# Patient Record
Sex: Male | Born: 1970 | Race: Black or African American | Hispanic: No | Marital: Married | Smoking: Never smoker
Health system: Southern US, Community
[De-identification: ages and names within clinical notes are randomized; demographics above are authoritative.]

## PROBLEM LIST (undated history)

## (undated) DIAGNOSIS — I10 Essential (primary) hypertension: Secondary | ICD-10-CM

## (undated) HISTORY — PX: NO PAST SURGERIES: SHX2092

---

## 2002-05-14 ENCOUNTER — Emergency Department (HOSPITAL_COMMUNITY): Admission: EM | Admit: 2002-05-14 | Discharge: 2002-05-14 | Payer: Self-pay | Admitting: Emergency Medicine

## 2002-06-20 ENCOUNTER — Emergency Department (HOSPITAL_COMMUNITY): Admission: EM | Admit: 2002-06-20 | Discharge: 2002-06-20 | Payer: Self-pay | Admitting: Emergency Medicine

## 2002-12-22 ENCOUNTER — Encounter: Admission: RE | Admit: 2002-12-22 | Discharge: 2002-12-22 | Payer: Self-pay | Admitting: Family Medicine

## 2003-01-05 ENCOUNTER — Encounter: Admission: RE | Admit: 2003-01-05 | Discharge: 2003-01-05 | Payer: Self-pay | Admitting: Sports Medicine

## 2003-03-20 ENCOUNTER — Encounter: Admission: RE | Admit: 2003-03-20 | Discharge: 2003-03-20 | Payer: Self-pay | Admitting: Family Medicine

## 2003-03-28 ENCOUNTER — Encounter: Admission: RE | Admit: 2003-03-28 | Discharge: 2003-03-28 | Payer: Self-pay | Admitting: Sports Medicine

## 2003-03-30 ENCOUNTER — Encounter: Admission: RE | Admit: 2003-03-30 | Discharge: 2003-03-30 | Payer: Self-pay | Admitting: Family Medicine

## 2003-10-01 ENCOUNTER — Emergency Department (HOSPITAL_COMMUNITY): Admission: EM | Admit: 2003-10-01 | Discharge: 2003-10-01 | Payer: Self-pay | Admitting: Emergency Medicine

## 2004-04-26 ENCOUNTER — Emergency Department (HOSPITAL_COMMUNITY): Admission: EM | Admit: 2004-04-26 | Discharge: 2004-04-26 | Payer: Self-pay | Admitting: Emergency Medicine

## 2006-01-13 ENCOUNTER — Emergency Department (HOSPITAL_COMMUNITY): Admission: EM | Admit: 2006-01-13 | Discharge: 2006-01-13 | Payer: Self-pay | Admitting: Emergency Medicine

## 2006-01-16 ENCOUNTER — Emergency Department (HOSPITAL_COMMUNITY): Admission: EM | Admit: 2006-01-16 | Discharge: 2006-01-16 | Payer: Self-pay | Admitting: Emergency Medicine

## 2006-10-19 ENCOUNTER — Emergency Department (HOSPITAL_COMMUNITY): Admission: EM | Admit: 2006-10-19 | Discharge: 2006-10-19 | Payer: Self-pay | Admitting: Emergency Medicine

## 2006-11-02 ENCOUNTER — Encounter: Admission: RE | Admit: 2006-11-02 | Discharge: 2006-11-02 | Payer: Self-pay | Admitting: Nephrology

## 2006-12-07 ENCOUNTER — Emergency Department (HOSPITAL_COMMUNITY): Admission: EM | Admit: 2006-12-07 | Discharge: 2006-12-07 | Payer: Self-pay | Admitting: Emergency Medicine

## 2007-09-02 ENCOUNTER — Emergency Department (HOSPITAL_COMMUNITY): Admission: EM | Admit: 2007-09-02 | Discharge: 2007-09-02 | Payer: Self-pay | Admitting: Emergency Medicine

## 2007-09-07 ENCOUNTER — Emergency Department (HOSPITAL_COMMUNITY): Admission: EM | Admit: 2007-09-07 | Discharge: 2007-09-08 | Payer: Self-pay | Admitting: Emergency Medicine

## 2008-02-02 ENCOUNTER — Emergency Department (HOSPITAL_COMMUNITY): Admission: EM | Admit: 2008-02-02 | Discharge: 2008-02-02 | Payer: Self-pay | Admitting: Emergency Medicine

## 2008-09-09 ENCOUNTER — Emergency Department (HOSPITAL_COMMUNITY): Admission: EM | Admit: 2008-09-09 | Discharge: 2008-09-09 | Payer: Self-pay | Admitting: Emergency Medicine

## 2009-02-04 ENCOUNTER — Emergency Department (HOSPITAL_COMMUNITY): Admission: EM | Admit: 2009-02-04 | Discharge: 2009-02-04 | Payer: Self-pay | Admitting: Emergency Medicine

## 2009-05-09 ENCOUNTER — Emergency Department (HOSPITAL_COMMUNITY): Admission: EM | Admit: 2009-05-09 | Discharge: 2009-05-09 | Payer: Self-pay | Admitting: Emergency Medicine

## 2009-06-28 ENCOUNTER — Emergency Department (HOSPITAL_COMMUNITY): Admission: EM | Admit: 2009-06-28 | Discharge: 2009-06-29 | Payer: Self-pay | Admitting: Emergency Medicine

## 2009-10-19 ENCOUNTER — Emergency Department (HOSPITAL_COMMUNITY): Admission: EM | Admit: 2009-10-19 | Discharge: 2009-10-19 | Payer: Self-pay | Admitting: Emergency Medicine

## 2009-11-25 ENCOUNTER — Emergency Department (HOSPITAL_COMMUNITY): Admission: EM | Admit: 2009-11-25 | Discharge: 2009-11-25 | Payer: Self-pay | Admitting: Emergency Medicine

## 2009-12-09 ENCOUNTER — Emergency Department (HOSPITAL_COMMUNITY): Admission: EM | Admit: 2009-12-09 | Discharge: 2009-12-09 | Payer: Self-pay | Admitting: Emergency Medicine

## 2010-05-13 ENCOUNTER — Inpatient Hospital Stay (INDEPENDENT_AMBULATORY_CARE_PROVIDER_SITE_OTHER)
Admission: RE | Admit: 2010-05-13 | Discharge: 2010-05-13 | Disposition: A | Discharge status: Home / Self Care | Payer: Self-pay | Source: Ambulatory Visit | Attending: Family Medicine | Admitting: Family Medicine

## 2010-05-13 DIAGNOSIS — N342 Other urethritis: Secondary | ICD-10-CM

## 2010-05-22 LAB — GC/CHLAMYDIA PROBE AMP, GENITAL
Chlamydia, DNA Probe: NEGATIVE
Chlamydia, DNA Probe: NEGATIVE
GC Probe Amp, Genital: NEGATIVE
GC Probe Amp, Genital: NEGATIVE

## 2010-05-23 LAB — RPR: RPR Ser Ql: NONREACTIVE

## 2010-05-23 LAB — GC/CHLAMYDIA PROBE AMP, GENITAL: Chlamydia, DNA Probe: NEGATIVE

## 2010-05-23 LAB — URINALYSIS, ROUTINE W REFLEX MICROSCOPIC
Bilirubin Urine: NEGATIVE
Hgb urine dipstick: NEGATIVE
Nitrite: NEGATIVE
Protein, ur: NEGATIVE mg/dL
Urobilinogen, UA: 1 mg/dL (ref 0.0–1.0)

## 2010-05-23 LAB — URINE CULTURE: Culture: NO GROWTH

## 2010-05-27 LAB — GC/CHLAMYDIA PROBE AMP, GENITAL
Chlamydia, DNA Probe: NEGATIVE
GC Probe Amp, Genital: NEGATIVE

## 2010-06-01 LAB — URINALYSIS, ROUTINE W REFLEX MICROSCOPIC
Bilirubin Urine: NEGATIVE
Hgb urine dipstick: NEGATIVE
Ketones, ur: NEGATIVE mg/dL
Nitrite: NEGATIVE
Protein, ur: 30 mg/dL — AB
Urobilinogen, UA: 1 mg/dL (ref 0.0–1.0)

## 2010-06-01 LAB — GC/CHLAMYDIA PROBE AMP, GENITAL
Chlamydia, DNA Probe: NEGATIVE
GC Probe Amp, Genital: NEGATIVE

## 2010-06-01 LAB — RAPID STREP SCREEN (MED CTR MEBANE ONLY): Streptococcus, Group A Screen (Direct): POSITIVE — AB

## 2010-06-11 LAB — GC/CHLAMYDIA PROBE AMP, GENITAL
Chlamydia, DNA Probe: NEGATIVE
GC Probe Amp, Genital: NEGATIVE

## 2010-12-09 LAB — URINALYSIS, ROUTINE W REFLEX MICROSCOPIC
Bilirubin Urine: NEGATIVE
Glucose, UA: NEGATIVE
Hgb urine dipstick: NEGATIVE
pH: 6

## 2011-09-28 ENCOUNTER — Emergency Department (INDEPENDENT_AMBULATORY_CARE_PROVIDER_SITE_OTHER)
Admission: EM | Admit: 2011-09-28 | Discharge: 2011-09-28 | Disposition: A | Discharge status: Home / Self Care | Payer: Self-pay | Source: Home / Self Care | Attending: Emergency Medicine | Admitting: Emergency Medicine

## 2011-09-28 ENCOUNTER — Encounter (HOSPITAL_COMMUNITY): Payer: Self-pay

## 2011-09-28 DIAGNOSIS — R358 Other polyuria: Secondary | ICD-10-CM

## 2011-09-28 DIAGNOSIS — R3589 Other polyuria: Secondary | ICD-10-CM

## 2011-09-28 LAB — GLUCOSE, CAPILLARY: Glucose-Capillary: 68 mg/dL — ABNORMAL LOW (ref 70–99)

## 2011-09-28 LAB — POCT URINALYSIS DIP (DEVICE)
Bilirubin Urine: NEGATIVE
Glucose, UA: NEGATIVE mg/dL
Ketones, ur: NEGATIVE mg/dL
pH: 7 (ref 5.0–8.0)

## 2011-09-28 NOTE — ED Provider Notes (Signed)
History     CSN: [id number]  Arrival date & time 09/28/11  1349   First MD Initiated Contact with Patient 09/28/11 1351      Chief Complaint  Patient presents with  . Urinary Frequency    (Consider location/radiation/quality/duration/timing/severity/associated sxs/prior treatment) HPI Comments: 41 year old male presents urgent care at complaining that during the day he has to urinate frequently this symptom seems to go come and go, he describes that he feels he needs to void freely during the day more than so at night. Denies any pain, pressure or burning with urination. Denies any penile discharge, no testicular pain. Patient denies any constitutional symptoms such as fevers, bodyaches, unintentional weight loss.  Patient is a 41 y.o. male presenting with frequency. The history is provided by the patient.  Urinary Frequency This is a new problem. The current episode started more than 1 week ago. The problem has not changed since onset.Pertinent negatives include no abdominal pain and no shortness of breath. He has tried nothing for the symptoms.    History reviewed. No pertinent past medical history.  History reviewed. No pertinent past surgical history.  History reviewed. No pertinent family history.  History  Substance Use Topics  . Smoking status: Not on file  . Smokeless tobacco: Not on file  . Alcohol Use: Not on file      Review of Systems  Constitutional: Negative for fever, chills, activity change, appetite change and unexpected weight change.  Respiratory: Negative for shortness of breath.   Gastrointestinal: Negative for abdominal pain.  Genitourinary: Positive for frequency. Negative for dysuria, urgency, decreased urine volume, discharge, penile swelling, scrotal swelling, enuresis, difficulty urinating, penile pain and testicular pain.  Musculoskeletal: Negative for back pain and arthralgias.    Allergies  Review of patient's allergies indicates not on  file.  Home Medications  No current outpatient prescriptions on file.  BP 169/100  Pulse 76  Temp 98.5 F (36.9 C) (Oral)  Resp 16  SpO2 98%  Physical Exam  Vitals reviewed. Constitutional: He appears well-developed and well-nourished. No distress.  Neurological: He is alert.  Psychiatric: He has a normal mood and affect.    ED Course  Procedures (including critical care time)  Labs Reviewed  GLUCOSE, CAPILLARY - Abnormal; Notable for the following:    Glucose-Capillary 68 (*)     All other components within normal limits  POCT URINALYSIS DIP (DEVICE) - Abnormal; Notable for the following:    Hgb urine dipstick TRACE (*)     All other components within normal limits   No results found.   1. Polyuria       MDM  On further history patient admits of drinking 3-4 glasses of soda swollen he's eating anything and at times also drinks a lot of juice during the day. Patient has been seen for similar symptoms on 3-4 different occasions which urine yield normal results. In multiple Chlamydia and gonorrhea DNA probes have been done with negative results. Today patient refuses to be tested for screened for STDs as he has no concerns and denies any penile discharge. He provided a urine sample today at urgent care which he describes he has no problems urinating or any type of discomfort. Patient was advised to do 2 things #1, decrease the amount of oral fluids especially sodas and monitor his frequent urination. #2 was advised to followup with the urologist to be screened for his prostate. He understood and agrees with both treatment, monitoring in reducing sodas and take and  followup with urology. Patient was also advised to followup with primary care Dr. for blood pressure repeat as his blood pressures were elevated today he agreed.        Jimmie Molly, MD 09/28/11 [phone]

## 2011-09-28 NOTE — ED Notes (Signed)
States he has been having to get up at night to urinate and has to void frequently during the day; denies pani w urination, but feels as if he is going more frequently and passing more UA than usual

## 2013-09-25 ENCOUNTER — Emergency Department (HOSPITAL_COMMUNITY): Payer: No Typology Code available for payment source

## 2013-09-25 ENCOUNTER — Encounter (HOSPITAL_COMMUNITY): Payer: Self-pay | Admitting: Emergency Medicine

## 2013-09-25 ENCOUNTER — Observation Stay (HOSPITAL_COMMUNITY)
Admission: EM | Admit: 2013-09-25 | Discharge: 2013-09-27 | Disposition: A | Discharge status: Home / Self Care | Payer: No Typology Code available for payment source | Attending: Internal Medicine | Admitting: Internal Medicine

## 2013-09-25 DIAGNOSIS — E781 Pure hyperglyceridemia: Secondary | ICD-10-CM | POA: Insufficient documentation

## 2013-09-25 DIAGNOSIS — R0602 Shortness of breath: Secondary | ICD-10-CM | POA: Insufficient documentation

## 2013-09-25 DIAGNOSIS — I10 Essential (primary) hypertension: Secondary | ICD-10-CM

## 2013-09-25 DIAGNOSIS — N289 Disorder of kidney and ureter, unspecified: Secondary | ICD-10-CM | POA: Diagnosis not present

## 2013-09-25 DIAGNOSIS — R0989 Other specified symptoms and signs involving the circulatory and respiratory systems: Secondary | ICD-10-CM

## 2013-09-25 DIAGNOSIS — Z9981 Dependence on supplemental oxygen: Secondary | ICD-10-CM

## 2013-09-25 DIAGNOSIS — J189 Pneumonia, unspecified organism: Secondary | ICD-10-CM

## 2013-09-25 DIAGNOSIS — J45909 Unspecified asthma, uncomplicated: Secondary | ICD-10-CM | POA: Insufficient documentation

## 2013-09-25 DIAGNOSIS — R0902 Hypoxemia: Secondary | ICD-10-CM | POA: Diagnosis present

## 2013-09-25 DIAGNOSIS — J441 Chronic obstructive pulmonary disease with (acute) exacerbation: Secondary | ICD-10-CM

## 2013-09-25 DIAGNOSIS — R55 Syncope and collapse: Secondary | ICD-10-CM | POA: Insufficient documentation

## 2013-09-25 DIAGNOSIS — R0600 Dyspnea, unspecified: Secondary | ICD-10-CM

## 2013-09-25 DIAGNOSIS — R0789 Other chest pain: Secondary | ICD-10-CM | POA: Diagnosis not present

## 2013-09-25 DIAGNOSIS — R030 Elevated blood-pressure reading, without diagnosis of hypertension: Secondary | ICD-10-CM | POA: Diagnosis present

## 2013-09-25 DIAGNOSIS — R0609 Other forms of dyspnea: Secondary | ICD-10-CM

## 2013-09-25 HISTORY — DX: Essential (primary) hypertension: I10

## 2013-09-25 LAB — COMPREHENSIVE METABOLIC PANEL
ALK PHOS: 81 U/L (ref 39–117)
ALT: 38 U/L (ref 0–53)
AST: 38 U/L — ABNORMAL HIGH (ref 0–37)
Albumin: 4.1 g/dL (ref 3.5–5.2)
Anion gap: 18 — ABNORMAL HIGH (ref 5–15)
BUN: 11 mg/dL (ref 6–23)
CO2: 22 mEq/L (ref 19–32)
Calcium: 9.1 mg/dL (ref 8.4–10.5)
Chloride: 100 mEq/L (ref 96–112)
Creatinine, Ser: 1.01 mg/dL (ref 0.50–1.35)
GFR calc non Af Amer: >90 mL/min (ref >90)
Glucose, Bld: 96 mg/dL (ref 70–99)
POTASSIUM: 4.8 meq/L (ref 3.7–5.3)
SODIUM: 140 meq/L (ref 137–147)
Total Bilirubin: 0.5 mg/dL (ref 0.3–1.2)
Total Protein: 8.5 g/dL — ABNORMAL HIGH (ref 6.0–8.3)

## 2013-09-25 LAB — I-STAT CHEM 8, ED
BUN: 11 mg/dL (ref 6–23)
CALCIUM ION: 1.15 mmol/L (ref 1.12–1.23)
CHLORIDE: 103 meq/L (ref 96–112)
Creatinine, Ser: 1.3 mg/dL (ref 0.50–1.35)
GLUCOSE: 107 mg/dL — AB (ref 70–99)
HEMATOCRIT: 49 % (ref 39.0–52.0)
Hemoglobin: 16.7 g/dL (ref 13.0–17.0)
Potassium: 4 mEq/L (ref 3.7–5.3)
Sodium: 141 mEq/L (ref 137–147)
TCO2: 25 mmol/L (ref 0–100)

## 2013-09-25 LAB — LIPID PANEL
CHOL/HDL RATIO: 4.4 ratio
CHOLESTEROL: 150 mg/dL (ref 0–200)
HDL: 34 mg/dL — ABNORMAL LOW (ref >39)
LDL Cholesterol: 59 mg/dL (ref 0–99)
Triglycerides: 284 mg/dL — ABNORMAL HIGH (ref <150)
VLDL: 57 mg/dL — ABNORMAL HIGH (ref 0–40)

## 2013-09-25 LAB — TROPONIN I: Troponin I: <0.3 ng/mL (ref <0.30)

## 2013-09-25 LAB — CBC
HEMATOCRIT: 45.3 % (ref 39.0–52.0)
Hemoglobin: 15.8 g/dL (ref 13.0–17.0)
MCH: 30.5 pg (ref 26.0–34.0)
MCHC: 34.9 g/dL (ref 30.0–36.0)
MCV: 87.5 fL (ref 78.0–100.0)
Platelets: 160 10*3/uL (ref 150–400)
RBC: 5.18 MIL/uL (ref 4.22–5.81)
RDW: 11.8 % (ref 11.5–15.5)
WBC: 3.6 10*3/uL — ABNORMAL LOW (ref 4.0–10.5)

## 2013-09-25 LAB — D-DIMER, QUANTITATIVE (NOT AT ARMC)

## 2013-09-25 LAB — RAPID URINE DRUG SCREEN, HOSP PERFORMED
AMPHETAMINES: NOT DETECTED
BARBITURATES: NOT DETECTED
BENZODIAZEPINES: NOT DETECTED
Cocaine: NOT DETECTED
Opiates: NOT DETECTED
TETRAHYDROCANNABINOL: NOT DETECTED

## 2013-09-25 LAB — PRO B NATRIURETIC PEPTIDE: Pro B Natriuretic peptide (BNP): 7.7 pg/mL (ref 0–125)

## 2013-09-25 LAB — HEMOGLOBIN A1C
Hgb A1c MFr Bld: 4.9 % (ref <5.7)
MEAN PLASMA GLUCOSE: 94 mg/dL (ref <117)

## 2013-09-25 LAB — STREP PNEUMONIAE URINARY ANTIGEN: Strep Pneumo Urinary Antigen: NEGATIVE

## 2013-09-25 LAB — I-STAT TROPONIN, ED: TROPONIN I, POC: 0.01 ng/mL (ref 0.00–0.08)

## 2013-09-25 LAB — HIV ANTIBODY (ROUTINE TESTING W REFLEX): HIV 1&2 Ab, 4th Generation: NONREACTIVE

## 2013-09-25 MED ORDER — SODIUM CHLORIDE 0.9 % IV SOLN
INTRAVENOUS | Status: AC
Start: 2013-09-25 — End: 2013-09-25
  Administered 2013-09-25 (×2): via INTRAVENOUS

## 2013-09-25 MED ORDER — ALBUTEROL SULFATE HFA 108 (90 BASE) MCG/ACT IN AERS
2.0000 | INHALATION_SPRAY | Freq: Once | RESPIRATORY_TRACT | Status: AC
  Administered 2013-09-25: 2 via RESPIRATORY_TRACT
  Filled 2013-09-25: qty 6.7

## 2013-09-25 MED ORDER — ONDANSETRON HCL 4 MG/2ML IJ SOLN: 4.0000 mg | Freq: Four times a day (QID) | INTRAMUSCULAR | Status: DC | PRN

## 2013-09-25 MED ORDER — ENOXAPARIN SODIUM 40 MG/0.4ML ~~LOC~~ SOLN
40.0000 mg | SUBCUTANEOUS | Status: DC
  Administered 2013-09-25 – 2013-09-26 (×2): 40 mg via SUBCUTANEOUS
  Filled 2013-09-25 (×3): qty 0.4

## 2013-09-25 MED ORDER — AZITHROMYCIN 500 MG PO TABS
500.0000 mg | ORAL_TABLET | ORAL | Status: DC
  Administered 2013-09-26 – 2013-09-27 (×2): 500 mg via ORAL
  Filled 2013-09-25 (×3): qty 1

## 2013-09-25 MED ORDER — ONDANSETRON HCL 4 MG PO TABS: 4.0000 mg | ORAL_TABLET | Freq: Four times a day (QID) | ORAL | Status: DC | PRN

## 2013-09-25 MED ORDER — AZITHROMYCIN 250 MG PO TABS
500.0000 mg | ORAL_TABLET | Freq: Once | ORAL | Status: AC
  Administered 2013-09-25: 500 mg via ORAL
  Filled 2013-09-25: qty 2

## 2013-09-25 MED ORDER — CEFTRIAXONE SODIUM 1 G IJ SOLR
1.0000 g | INTRAMUSCULAR | Status: DC
  Administered 2013-09-25 – 2013-09-26 (×2): 1 g via INTRAVENOUS
  Filled 2013-09-25 (×2): qty 10

## 2013-09-25 MED ORDER — SODIUM CHLORIDE 0.9 % IJ SOLN
3.0000 mL | Freq: Two times a day (BID) | INTRAMUSCULAR | Status: DC
  Administered 2013-09-25 – 2013-09-26 (×3): 3 mL via INTRAVENOUS

## 2013-09-25 MED ORDER — IPRATROPIUM-ALBUTEROL 0.5-2.5 (3) MG/3ML IN SOLN
3.0000 mL | RESPIRATORY_TRACT | Status: DC
  Administered 2013-09-25 (×2): 3 mL via RESPIRATORY_TRACT
  Filled 2013-09-25 (×4): qty 3

## 2013-09-25 NOTE — ED Notes (Signed)
Phlebotomy at the bedside  

## 2013-09-25 NOTE — ED Notes (Signed)
Oxygen at 2l via  due to sats less than 90

## 2013-09-25 NOTE — Progress Notes (Signed)
ABG attempt X 1. Pt moved and said he didn't want the abg to be done. Pt refuses abg at this time. Pt is on RA sats 95%. Pt in no distress a this time.

## 2013-09-25 NOTE — Consult Note (Signed)
CARDIOLOGY CONSULT NOTE     Patient ID: Dustin Luna MRN: [id number] DOB/AGE: Feb 12, 1971 43 y.o.  Admit date: 09/25/2013 Referring Physician Charlsie Merles MD Primary Physician No PCP Per Patient Primary Cardiologist N/A Reason for Consultation chest pain  HPI: Mr. Dustin Luna is a 43 yo BM seen at the request of the internal medicine teaching service for evaluation of chest pain. He reports a head and chest cold a couple of weeks ago. Since then he has experienced symptoms of SOB at rest and with exertion. Minimal cough. No fever. He has some intermittent chest pain described as a pulled muscle in the left pectoral region. This is not related to exertion. He denies wheezes. No prior cardiac history. He has some intermittent elevation of BP but has never required antihypertensive therapy. No history of tobacco use. Denies DM. Doesn't know his cholesterol. He states he doesn't get any exercise and is currently unemployed.  Past Medical History  Diagnosis Date  . Hypertension     No family history on file.  History   Social History  . Marital Status: Single    Spouse Name: N/A    Number of Children: N/A  . Years of Education: N/A   Occupational History  . Not on file.   Social History Main Topics  . Smoking status: Never Smoker   . Smokeless tobacco: Never Used  . Alcohol Use: Yes  . Drug Use: No  . Sexual Activity: Yes   Other Topics Concern  . Not on file   Social History Narrative  . No narrative on file    History reviewed. No pertinent past surgical history.   No prescriptions prior to admission     ROS: As noted in HPI. Some runny nose. No edema. All other systems are reviewed and are negative unless otherwise mentioned.   Physical Exam: Blood pressure 109/73, pulse 81, temperature 98.1 F (36.7 C), temperature source Oral, resp. rate 20, height 5\' 10"  (1.778 m), weight 227 lb (102.967 kg), SpO2 96.00%. Current Weight  09/25/13 227 lb (102.967 kg)    GENERAL:   Well appearing BM in NAD HEENT:  PERRL, EOMI, sclera are clear. Oropharynx is clear. NECK:  No jugular venous distention, carotid upstroke brisk and symmetric, no bruits, no thyromegaly or adenopathy LUNGS:  Clear to auscultation bilaterally CHEST:  Unremarkable HEART:  RRR,  PMI not displaced or sustained,S1 and S2 within normal limits, no S3, no S4: no clicks, no rubs, no murmurs ABD:  Soft, nontender. BS +, no masses or bruits. No hepatomegaly, no splenomegaly EXT:  2 + pulses throughout, no edema, no cyanosis no clubbing SKIN:  Warm and dry.  No rashes NEURO:  Alert and oriented x 3. Cranial nerves II through XII intact. PSYCH:  Cognitively intact    Labs:   Lab Results  Component Value Date   WBC 3.6* 09/25/2013   HGB 16.7 09/25/2013   HCT 49.0 09/25/2013   MCV 87.5 09/25/2013   PLT 160 09/25/2013     Recent Labs Lab 09/25/13 0935  NA 140  K 4.8  CL 100  CO2 22  BUN 11  CREATININE 1.01  CALCIUM 9.1  PROT 8.5*  BILITOT 0.5  ALKPHOS 81  ALT 38  AST 38*  GLUCOSE 96   Lab Results  Component Value Date   TROPONINI <0.30 09/25/2013   TROPONINI <0.30 09/25/2013    Lab Results  Component Value Date   CHOL 150 09/25/2013   Lab Results  Component Value Date  HDL 34* 09/25/2013   Lab Results  Component Value Date   LDLCALC 59 09/25/2013   Lab Results  Component Value Date   TRIG 284* 09/25/2013   Lab Results  Component Value Date   CHOLHDL 4.4 09/25/2013   No results found for this basename: LDLDIRECT    Lab Results  Component Value Date   PROBNP 7.7 09/25/2013   No results found for this basename: TSH   Lab Results  Component Value Date   HGBA1C 4.9 09/25/2013   D-dimer < .27  Radiology: Dg Chest 2 View  09/25/2013   CLINICAL DATA:  Shortness of breath, cough.  EXAM: CHEST  2 VIEW  COMPARISON:  None available  FINDINGS: There is mild cardiomegaly. Mediastinal silhouette within normal limits.  The lungs are hypoinflated. Patchy bibasilar opacities are  present, favored to reflect atelectasis related to shallow lung inflation, although possible infiltrates are not entirely excluded, particularly at the medial right lung base. No pulmonary edema or pleural effusion. No pneumothorax.  No acute osseous abnormality identified.  IMPRESSION: 1. Shallow lung inflation with patchy bibasilar opacities. While atelectasis is favored, possible superimposed infiltrates are not entirely excluded, particularly at the right lung base. 2. Cardiomegaly without pulmonary edema or pleural effusion.   Electronically Signed   By: Rise MuBenjamin  McClintock M.D.   On: 09/25/2013 06:46    EKG: NSR, borderling early repolarization.  ASSESSMENT AND PLAN:  1. Atypical chest pain. Doubt cardiac. History more suggestive of musculoskeletal pain. Low cardiac risk with negative Ecg and cardiac enzymes.   2. Dyspnea with hypoxia. History suggestive of inflammatory process. No evidence of CHF. With normal D-dimer PE is unlikely. Given mild cardiomegaly on CXR will check Echo but I think current workup outlined by medical service is appropriate. If Echo is normal I don't think any other cardiac workup is indicated.   Signed: Camaya Gannett [place], MDFACC  09/25/2013, 4:19 PM

## 2013-09-25 NOTE — ED Notes (Signed)
Ambulated pt with portable pulse ox. Pt sats dropped to 88-89% but came back up to 91-92 % while he was walking. Upon getting back to the room, his saturation went up to 94 %.

## 2013-09-25 NOTE — ED Provider Notes (Signed)
CSN: [id number]     Arrival date & time 09/25/13  0527 History   First MD Initiated Contact with Patient 09/25/13 0559     Chief Complaint  Patient presents with  . Shortness of Breath     (Consider location/radiation/quality/duration/timing/severity/associated sxs/prior Treatment) HPI Comments: Patient is a 43 yo M PMHx significant for HTN presenting to the ED for one month of intermittent episodes of shortness of breath, last episode was this morning. The patient's wife states that he was walking around very short of breath, appeared uncomfortable and to be sweating. Patient states his breathing has improved since being in the ED, but has some lingering chest tightness. Alleviating factors: none. Aggravating factors: deep inspiration. Medications tried prior to arrival: none. Patient is just getting over a cold. Denies any history of SOB similar in past. PERC negative.     Patient is a 43 y.o. male presenting with shortness of breath.  Shortness of Breath   Past Medical History  Diagnosis Date  . Hypertension    History reviewed. No pertinent past surgical history. No family history on file. History  Substance Use Topics  . Smoking status: Never Smoker   . Smokeless tobacco: Never Used  . Alcohol Use: Yes    Review of Systems  Respiratory: Positive for chest tightness and shortness of breath.   All other systems reviewed and are negative.     Allergies  Review of patient's allergies indicates no known allergies.  Home Medications   Prior to Admission medications   Not on File   BP 130/89  Pulse 82  Temp(Src) 98.1 F (36.7 C) (Oral)  Resp 10  Ht 5\' 10"  (1.778 m)  Wt 230 lb (104.327 kg)  BMI 33.00 kg/m2  SpO2 92% Physical Exam  Nursing note and vitals reviewed. Constitutional: He is oriented to person, place, and time. He appears well-developed and well-nourished. No distress.  HENT:  Head: Normocephalic and atraumatic.  Right Ear: External ear normal.   Left Ear: External ear normal.  Nose: Nose normal.  Mouth/Throat: Oropharynx is clear and moist. No oropharyngeal exudate.  Eyes: Conjunctivae are normal.  Neck: Normal range of motion. Neck supple.  Cardiovascular: Normal rate, regular rhythm and normal heart sounds.   Pulmonary/Chest: Effort normal. No respiratory distress. He has decreased breath sounds (mildly diminished in lower lung fields). He exhibits tenderness.  Abdominal: Soft. Bowel sounds are normal. There is no tenderness.  Musculoskeletal: Normal range of motion. He exhibits no edema.  Lymphadenopathy:    He has cervical adenopathy.  Neurological: He is alert and oriented to person, place, and time.  Skin: Skin is warm and dry. He is not diaphoretic.  Psychiatric: He has a normal mood and affect.    ED Course  Procedures (including critical care time) Medications  albuterol (PROVENTIL HFA;VENTOLIN HFA) 108 (90 BASE) MCG/ACT inhaler 2 puff (2 puffs Inhalation Given 09/25/13 0721)  azithromycin (ZITHROMAX) tablet 500 mg (500 mg Oral Given 09/25/13 0720)    Labs Review Labs Reviewed  CBC - Abnormal; Notable for the following:    WBC 3.6 (*)    All other components within normal limits  I-STAT CHEM 8, ED - Abnormal; Notable for the following:    Glucose, Bld 107 (*)    All other components within normal limits  PRO B NATRIURETIC PEPTIDE  I-STAT TROPOININ, ED    Imaging Review Dg Chest 2 View  09/25/2013   CLINICAL DATA:  Shortness of breath, cough.  EXAM: CHEST  2 VIEW  COMPARISON:  None available  FINDINGS: There is mild cardiomegaly. Mediastinal silhouette within normal limits.  The lungs are hypoinflated. Patchy bibasilar opacities are present, favored to reflect atelectasis related to shallow lung inflation, although possible infiltrates are not entirely excluded, particularly at the medial right lung base. No pulmonary edema or pleural effusion. No pneumothorax.  No acute osseous abnormality identified.   IMPRESSION: 1. Shallow lung inflation with patchy bibasilar opacities. While atelectasis is favored, possible superimposed infiltrates are not entirely excluded, particularly at the right lung base. 2. Cardiomegaly without pulmonary edema or pleural effusion.   Electronically Signed   By: Rise MuBenjamin  McClintock M.D.   On: 09/25/2013 06:46     EKG Interpretation   Date/Time:  Monday September 25 2013 05:31:56 EDT Ventricular Rate:  80 PR Interval:  161 QRS Duration: 81 QT Interval:  374 QTC Calculation: 431 R Axis:   58 Text Interpretation:  Sinus rhythm ST elev, probable normal early repol  pattern No old tracing to compare Confirmed by OTTER  MD, OLGA ([id number]) on  09/25/2013 5:41:42 AM      MDM   Final diagnoses:  CAP (community acquired pneumonia)  Hypoxia    Filed Vitals:   09/25/13 0715  BP: 130/89  Pulse: 82  Temp:   Resp:    Afebrile, NAD, non-toxic appearing, AAOx4.  Patient presenting with one month of intermittent shortness of breath and chest tightness. Per EMS patient with low oxygen saturations upon arrival placed on 2 L of nasal cannula. No signs of respiratory distress. No accessory muscle use. No tachypnea or tachycardia. Mildly diminished breath sounds in lower lung fields. Patient taken off of oxygen, O2 saturations dropped down to 89%. Chest x-ray concerning for possible community-acquired pneumonia with infiltrate. I have reviewed nursing notes, vital signs, and all appropriate lab and imaging results for this patient. Oxygen saturations waxed and waned off oxygen. Attempted ambulate patient was unable to maintain oxygen saturations above 87 to 88%. Will admit patient for community-acquired pneumonia with hypoxia to internal medicine teaching service. Patient d/w with Dr. Norlene Campbelltter who agrees with plan.      Lise AuerJennifer L Cresta Riden, PA-C 09/25/13 1026

## 2013-09-25 NOTE — ED Notes (Signed)
Pt. admitted to floor prior to my arrival to room for ABG drawl, floor RT made aware.

## 2013-09-25 NOTE — Progress Notes (Signed)
Called ED for report at [phone]. RN was tied up with another patient. Left number for her to return call. Awaiting report.

## 2013-09-25 NOTE — Progress Notes (Signed)
Pt admitted to the unit at 1003. Pt mental status is A & O x4. Pt oriented to room, staff, and call bell. Skin is intact. Full assessment charted in [facility]. Call bell within reach. Visitor guidelines reviewed w/ pt and/or family.

## 2013-09-25 NOTE — ED Notes (Signed)
Pt resting; family has stepped out for a moment, will return later; no needs at this time

## 2013-09-25 NOTE — ED Notes (Signed)
Patient presents via EMS.  EMS reports that he has been SOB off and on for the past month.  Upon arrival patient has a very flat effect and just looks at you when you ask a question.. EMS reported that there is a ne baby in the house.  Tonight he was up walking around having difficulty breathing.  Stated sometimes when he is just sitting he takes a deep breath and reports that he is having trouble breathing.  No anxiety diagnosed

## 2013-09-25 NOTE — H&P (Signed)
Date: 09/25/2013               Patient Name:  Dustin Luna MRN: [id number]  DOB: 1970-08-14 Age / Sex: 43 y.o., male   PCP: No Pcp Per Patient         Medical Service: Internal Medicine Teaching Service         Attending Physician: Dr. Aletta Edouard, MD    First Contact: Dr. Christen Bame Pager: [phone]  Second Contact: Dr. Farley Ly Pager: [phone]       After Hours (After 5p/  First Contact Pager: [phone]  weekends / holidays): Second Contact Pager: [phone]   Chief Complaint: SOB and chest pain/pressure  History of Present Illness:  Pt is a 43 yo M w/ PMH HTN and possibly asthma presents with worsening SOB and chest pain.   He states that he has been experiencing left sided chest pain and SOB for the past month. The pain is under his left breast and is primarily throbbing in nature but is occasionally sharp and can be a quick pain or can last for minutes but is improved when he holds his breath. His SOB occurs esp with exertion, while walking up the stairs or with exertion. He sleeps on 2 pillow but is able to lie flat to sleep and denies any peripheral edema. He denies any fevers, but his wife states that he has been having chills and rigors. He states that he has had a runny nose, nasal congestion, and productive cough with yellow mucus for the past 2 weeks, and has been more lethargic over the past month with his SOB/DOE. He denies any smoking history. He states that his daughter had been sick with a cold about 1 month ago.   Of note, he states that about 5 ys ago he was experiencing similar SOB and DOE and was given an albuterol inhaler to try, but he states that he may have used it 1-2x and is unsure if it helped.   He denies any person history of blood clots, but states that his father did have a history of clots. He states that his father and his brother both hve a h/o CAD and had stenting in their 43-60s.    Meds: No current facility-administered medications for this  encounter.   No current outpatient prescriptions on file.    Allergies: Allergies as of 09/25/2013  . (No Known Allergies)   Past Medical History  Diagnosis Date  . Hypertension    History reviewed. No pertinent past surgical history. No family history on file. History   Social History  . Marital Status: Single    Spouse Name: N/A    Number of Children: N/A  . Years of Education: N/A   Occupational History  . Not on file.   Social History Main Topics  . Smoking status: Never Smoker   . Smokeless tobacco: Never Used  . Alcohol Use: Yes  . Drug Use: No  . Sexual Activity: Yes   Other Topics Concern  . Not on file   Social History Narrative  . No narrative on file    Review of Systems: Review of Systems  Constitutional: Denies fever, +chills, diaphoresis, shivering.  HEENT: +congestion, rhinorrhea, sneezing Respiratory: +SOB, DOE, productive cough.  Cardiovascular: +chest pain. Denies leg swelling.  Gastrointestinal: Denies nausea, vomiting, abdominal pain, diarrhea, constipation Genitourinary: Denies dysuria, urgency, frequency  Musculoskeletal: Denies myalgias, joint swelling, arthralgias or gait problem.  Skin: Denies rash or wound.  Neurological: Denies  dizziness, seizures, syncope, weakness Psychiatric/Behavioral: Denies suicidal ideation, mood changes, or confusion   Physical Exam: Blood pressure 122/75, pulse 75, temperature 98.1 F (36.7 C), temperature source Oral, resp. rate 17, height 5\' 10"  (1.778 m), weight 230 lb (104.327 kg), SpO2 93.00%. General: Alert & oriented x 3, well-developed, and cooperative on examination.  Head: Normocephalic and atraumatic.  Eyes: Vision grossly intact, pupils equal, round, and reactive to light, no injection and anicteric.  Mouth: Pharynx pink and moist, no erythema or exudates.  Neck: Supple, full ROM, no thyromegaly, no JVD, and no carotid bruits.  Lungs: CTAB, normal respiratory effort, no accessory muscle use,  no crackles, and no wheezes. Heart: Regular rate, regular rhythm, no murmur, no gallop, and no rub.  Abdomen: Soft, non-tender, non-distended, normal bowel sounds, no guarding, no rebound tenderness. Pt flexing abdominal muscles and unable to stop, so difficult to palpate  Msk: No joint swelling, warmth, or erythema.  Extremities: 2+ radial and DP pulses bilaterally. No clubbing or edema Neurologic: Alert & oriented X3, cranial nerves II-XII intact, strength normal in all extremities, sensation intact to light touch, gait not observed.  Skin: Turgor normal and no rashes, multiple tattoos.  Psych: Memory intact for recent and remote, normally interactive   Lab results: Basic Metabolic Panel:  Recent Labs  96/06/5405/20/15 0647  NA 141  K 4.0  CL 103  GLUCOSE 107*  BUN 11  CREATININE 1.30   Liver Function Tests: Pening  CBC:  Recent Labs  09/25/13 0616 09/25/13 0647  WBC 3.6*  --   HGB 15.8 16.7  HCT 45.3 49.0  MCV 87.5  --   PLT 160  --    Cardiac Enzymes: i-stat Troponin 0.01  BNP:  Recent Labs  09/25/13 0616  PROBNP 7.7   D-Dimer: Pending  Hemoglobin A1C: Pending  Fasting Lipid Panel: Pending  Urine Drug Screen: Drugs of Abuse  No results found for this basename: labopia,  cocainscrnur,  labbenz,  amphetmu,  thcu,  labbarb  Pending   Imaging results:  Dg Chest 2 View  09/25/2013   CLINICAL DATA:  Shortness of breath, cough.  EXAM: CHEST  2 VIEW  COMPARISON:  None available  FINDINGS: There is mild cardiomegaly. Mediastinal silhouette within normal limits.  The lungs are hypoinflated. Patchy bibasilar opacities are present, favored to reflect atelectasis related to shallow lung inflation, although possible infiltrates are not entirely excluded, particularly at the medial right lung base. No pulmonary edema or pleural effusion. No pneumothorax.  No acute osseous abnormality identified.  IMPRESSION: 1. Shallow lung inflation with patchy bibasilar opacities.  While atelectasis is favored, possible superimposed infiltrates are not entirely excluded, particularly at the right lung base. 2. Cardiomegaly without pulmonary edema or pleural effusion.   Electronically Signed   By: Rise MuBenjamin  McClintock M.D.   On: 09/25/2013 06:46    Other results: EKG: Sinus rhythm with ST changes in V2-4. T-wave inversion in III. QTc normal. No previous EKG for comparision.  Assessment & Plan by Problem: Pt is a 43 yo M w/ PMH HTN and possibly asthma presents with worsening SOB and chest pain.   #Hypoxemia requiring supplemental oxygen: Pt with 1 month h/o SOB, DOE, and left sided chest pain. His daughter was sick with a cold about 1 month ago, but the pt states that his sx began before then. He endorses having a cold with nasal congestion, rhinorrhea, and productive cough with yellow sputum over the past 2 week, and has a possible history of untreated  asthma. CXR concerning for PNA with patchy infiltrates esp in right lung base, and CAP coverage started in the ED. However, he continues to have O2 requirement and desaturated to 89% on room air off O2 and down to 87% while ambulating. On exam, he is 91% off O2. EKG with ST changes in V2-4 with no prior EKG, and i-stat troponin negative x1. Since the pain has been occuring for about 1 mo, ACS is lower on the differential, TIMI 2, with 1 point for h/o HTN, although his BP is normal in the ED off medications. He is afebrile with no leukocytosis, which would be expected if he had CAP. With his decreased activity, I am concerned that he may have a PE. However, he is not tachycardic; Wells Score is moderate risk for PE but Revised Louie Casa is low risk, so will check d-dimer and if positive, will need to perform CTA vs V/Q to r/o PE. Possible symptoms are form residual URI in the setting of untreated asthma, and in that case, will need to start steroids, pending the results of these other tests and after r/o infection.  - Admit to Obs with  telemetry to IMTS - Azithromycin and Rocephin for CAP - Supplemental O2 for O2 saturation <92% - BCx x2, sputum cx, and gram stain - Legionella and Strep urine Ag - Repeat CXR in AM - Duonebs q4h - Repeat EKG today, if persistent ST changes, may need to consult Cardiology - Trending troponin q6h x3 - Checking HIV - Repeating CBC 2/2 mild leukopenia - Checking d-dimer  #Renal insufficiency: Cr 1.3 on admission. No baseline values.  - Repeating BMP  - NS@125  x12 hours   Dispo: Disposition is deferred at this time, awaiting improvement of current medical problems. Anticipated discharge in approximately 1-2 day(s).   The patient does not have a current PCP (No Pcp Per Patient) and may  need an [facility] hospital follow-up appointment after discharge.  The patient does not have transportation limitations that hinder transportation to clinic appointments.  Signed: Genelle Gather, MD 09/25/2013, 9:18 AM

## 2013-09-26 ENCOUNTER — Observation Stay (HOSPITAL_COMMUNITY): Payer: No Typology Code available for payment source

## 2013-09-26 DIAGNOSIS — I369 Nonrheumatic tricuspid valve disorder, unspecified: Secondary | ICD-10-CM

## 2013-09-26 LAB — CBC
HEMATOCRIT: 44.3 % (ref 39.0–52.0)
HEMOGLOBIN: 15.1 g/dL (ref 13.0–17.0)
MCH: 30.2 pg (ref 26.0–34.0)
MCHC: 34.1 g/dL (ref 30.0–36.0)
MCV: 88.6 fL (ref 78.0–100.0)
Platelets: 181 10*3/uL (ref 150–400)
RBC: 5 MIL/uL (ref 4.22–5.81)
RDW: 12.1 % (ref 11.5–15.5)
WBC: 5.1 10*3/uL (ref 4.0–10.5)

## 2013-09-26 LAB — LEGIONELLA ANTIGEN, URINE: Legionella Antigen, Urine: NEGATIVE

## 2013-09-26 LAB — BASIC METABOLIC PANEL
ANION GAP: 13 (ref 5–15)
BUN: 12 mg/dL (ref 6–23)
CHLORIDE: 102 meq/L (ref 96–112)
CO2: 27 mEq/L (ref 19–32)
Calcium: 8.6 mg/dL (ref 8.4–10.5)
Creatinine, Ser: 1.23 mg/dL (ref 0.50–1.35)
GFR calc Af Amer: 82 mL/min — ABNORMAL LOW (ref >90)
GFR calc non Af Amer: 71 mL/min — ABNORMAL LOW (ref >90)
Glucose, Bld: 82 mg/dL (ref 70–99)
Potassium: 4.3 mEq/L (ref 3.7–5.3)
SODIUM: 142 meq/L (ref 137–147)

## 2013-09-26 LAB — PROCALCITONIN

## 2013-09-26 MED ORDER — IPRATROPIUM-ALBUTEROL 0.5-2.5 (3) MG/3ML IN SOLN: 3.0000 mL | Freq: Four times a day (QID) | RESPIRATORY_TRACT | Status: DC | PRN

## 2013-09-26 MED ORDER — GEMFIBROZIL 600 MG PO TABS
600.0000 mg | ORAL_TABLET | Freq: Two times a day (BID) | ORAL | Status: DC
  Administered 2013-09-26 – 2013-09-27 (×2): 600 mg via ORAL
  Filled 2013-09-26 (×4): qty 1

## 2013-09-26 NOTE — Progress Notes (Signed)
Subjective: He reports that he feels well overall with no complaints. He specifically denies any chest pain, shortness of breath, fevers, chills, nausea, vomiting.  Objective: Vital signs in last 24 hours: Filed Vitals:   09/25/13 1547 09/25/13 2039 09/25/13 2106 09/26/13 0530  BP:   124/87 121/83  Pulse:  88 82 67  Temp:   98.7 F (37.1 C) 98.3 F (36.8 C)  TempSrc:   Oral Oral  Resp:  18 20 18   Height:      Weight:      SpO2: 96% 96% 95% 96%   Weight change: -1.361 kg (-3 lb)  Intake/Output Summary (Last 24 hours) at 09/26/13 [id number] Last data filed at 09/25/13 1950  Gross per 24 hour  Intake    240 ml  Output      0 ml  Net    240 ml   General: resting in bed HEENT: EOMI, no scleral icterus Cardiac: RRR, no rubs, murmurs or gallops Pulm: clear to auscultation bilaterally, moving normal volumes of air Ext: warm and well perfused, no pedal edema Neuro: alert and oriented X3  Lab Results:  CBC Latest Ref Rng 09/26/2013 09/25/2013 09/25/2013  WBC 4.0 - 10.5 K/uL 5.1 - 3.6(L)  Hemoglobin 13.0 - 17.0 g/dL 96.015.1 45.416.7 09.815.8  Hematocrit 39.0 - 52.0 % 44.3 49.0 45.3  Platelets 150 - 400 K/uL 181 - 160   BMET    Component Value Date/Time   NA 142 09/26/2013 0452   K 4.3 09/26/2013 0452   CL 102 09/26/2013 0452   CO2 27 09/26/2013 0452   GLUCOSE 82 09/26/2013 0452   BUN 12 09/26/2013 0452   CREATININE 1.23 09/26/2013 0452   CALCIUM 8.6 09/26/2013 0452   GFRNONAA 71* 09/26/2013 0452   GFRAA 82* 09/26/2013 0452   Procalcitonin: <0.10 (09/26/2013)  Troponin I 09/25/2013 8:58PM  <0.30 09/25/2013 3:39PM  <0.30 09/25/2013 10:32AM <0.30  Legionella antigen, urine (09/25/2013) Negative  Strep pneumoniae, urine (09/25/2013) Negative  D-dimer (09/25/2013) <0.27  HIV antibody (09/25/2013) Nonreactive  Lab Results  Component Value Date   CREATININE 1.23 09/26/2013   CREATININE 1.01 09/25/2013   CREATININE 1.30 09/25/2013    Drugs of Abuse     Component Value Date/Time   LABOPIA  NONE DETECTED 09/25/2013 1403   COCAINSCRNUR NONE DETECTED 09/25/2013 1403   LABBENZ NONE DETECTED 09/25/2013 1403   AMPHETMU NONE DETECTED 09/25/2013 1403   THCU NONE DETECTED 09/25/2013 1403   LABBARB NONE DETECTED 09/25/2013 1403   Micro Results: Recent Results (from the past 240 hour(s))  CULTURE, BLOOD (ROUTINE X 2)     Status: None   Collection Time    09/25/13  9:35 AM      Result Value Ref Range Status   Specimen Description BLOOD RIGHT ANTECUBITAL   Final   Special Requests     Final   Value: BOTTLES DRAWN AEROBIC AND ANAEROBIC BLUE 10CC RED 5CC   Culture  Setup Time     Final   Value: 09/25/2013 16:13     Performed at [facility]   Culture     Final   Value:        BLOOD CULTURE RECEIVED NO GROWTH TO DATE CULTURE WILL BE HELD FOR 5 DAYS BEFORE ISSUING A FINAL NEGATIVE REPORT     Performed at [facility]   Report Status PENDING   Incomplete  CULTURE, BLOOD (ROUTINE X 2)     Status: None   Collection Time    09/25/13  9:40 AM      Result Value Ref Range Status   Specimen Description BLOOD LEFT ANTECUBITAL   Final   Special Requests BOTTLES DRAWN AEROBIC ONLY 10CC   Final   Culture  Setup Time     Final   Value: 09/25/2013 16:13     Performed at [facility]   Culture     Final   Value:        BLOOD CULTURE RECEIVED NO GROWTH TO DATE CULTURE WILL BE HELD FOR 5 DAYS BEFORE ISSUING A FINAL NEGATIVE REPORT     Performed at [facility]   Report Status PENDING   Incomplete   Studies/Results: X-ray Chest Pa And Lateral   09/26/2013   CLINICAL DATA:  Shortness of breath. Possible community acquired pneumonia.  EXAM: CHEST  2 VIEW  COMPARISON:  Chest x-ray 09/25/2013.  FINDINGS: Lung volumes are low. No consolidative airspace disease. No pleural effusions. No pneumothorax. No pulmonary nodule or mass noted. Pulmonary vasculature and the cardiomediastinal silhouette are within normal limits.  IMPRESSION: 1. Low lung volumes without radiographic  evidence of acute cardiopulmonary disease.   Electronically Signed   By: Trudie Reed M.D.   On: 09/26/2013 08:14   Dg Chest 2 View  09/25/2013   CLINICAL DATA:  Shortness of breath, cough.  EXAM: CHEST  2 VIEW  COMPARISON:  None available  FINDINGS: There is mild cardiomegaly. Mediastinal silhouette within normal limits.  The lungs are hypoinflated. Patchy bibasilar opacities are present, favored to reflect atelectasis related to shallow lung inflation, although possible infiltrates are not entirely excluded, particularly at the medial right lung base. No pulmonary edema or pleural effusion. No pneumothorax.  No acute osseous abnormality identified.  IMPRESSION: 1. Shallow lung inflation with patchy bibasilar opacities. While atelectasis is favored, possible superimposed infiltrates are not entirely excluded, particularly at the right lung base. 2. Cardiomegaly without pulmonary edema or pleural effusion.   Electronically Signed   By: Rise Mu M.D.   On: 09/25/2013 06:46   Medications: I have reviewed the patient's current medications. Scheduled Meds: . azithromycin  500 mg Oral Q24H  . cefTRIAXone (ROCEPHIN)  IV  1 g Intravenous Q24H  . enoxaparin (LOVENOX) injection  40 mg Subcutaneous Q24H  . sodium chloride  3 mL Intravenous Q12H   Continuous Infusions:  PRN Meds:.ipratropium-albuterol, ondansetron (ZOFRAN) IV, ondansetron Assessment/Plan: Principal Problem:   Hypoxemia requiring supplemental oxygen Active Problems:   HTN (hypertension)   Renal insufficiency  Pt is a 43 yo M w/ PMH HTN and possibly asthma presents with worsening SOB and chest pain.   1. Hypoxemia requiring supplemental oxygen:   Patient presented with a 1 month history of exertional dyspnea and left sided chest pain. He has a 63 year old daughter who was sick with a very bad cold, but he believes his symptoms began before then. He feels that he had a cold with nasal congestion, rhinorrhea, and productive  cough with yellow sputum over the past 2 weeks. CXR concerning for PNA with patchy infiltrates esp in right lung base, and CAP coverage started in the ED. He is averaging 95-96% on room air. He desaturated to 89% on room air off O2 on 7/20 and required O2 at that time.  EKG with ST changes in V2-4 with no prior EKG, and i-stat troponin negative x3. Repeat EKG (7/21) was unchanged. Continued to show early repolarizations, normal variant. Per cardiology, chest pain history more suggestive of musculoskeletal pain. Dyspnea history suggestive  of inflammatory process. If Echo is normal, no further cardiac workup is indicated.  Differential diagnosis for this 1 month history of chest pain and dyspnea includes: - ACS, although this is lower on the list due to the longer course of the pain and the waxing and waning nature. Additionally, troponins negative x3 and EKG x2 not concerning for ACS.  - He has received coverage for CAP, but he is afebrile and does not have leukocytosis as we would expect with CAP. Additionally, his procalcitonin is 0.10, which is at the recommended level for stopping antibiotics.  - PE was of concern considering his symptoms and a potential family history of clots. However, his D-dimer is <0.27, making this very unlikely. - Most likely he has a viral URI and potentially some untreated asthma (per patient history of being told that he had asthma several years ago). Asthma work up will be of use and help determine if he should be started on steroids.  - Consider termination of antibiotics, as CAP lower on the list - Supplemental O2 for O2 saturation <92%  - BCx x2, sputum cx, and gram stain - pending - Repeat CXR and then consider CT - Duonebs q4h - Repeat CBC - Echocardiogram pending - Peak expiratory flow rate pending - Mycoplasma antibody pending. If Mycoplasma, can transition to only Azithromycin - Lovenox for DVT ppx  2. Renal insufficiency: Cr 1.3 on admission. Down to 1.23  (7/21). No baseline values.  - Repeating BMP to monitor  3. Abnormal lipid panel High triglycerides (284) and low HDL (34) on 09/25/2013.  - Begin gemfibrozil  Dispo: Disposition is deferred at this time, awaiting improvement of current medical problems.  Anticipated discharge in approximately 1-2 day(s).   The patient does not have a current PCP (No Pcp Per Patient) and does need an [facility] hospital follow-up appointment after discharge.  The patient does not have transportation limitations that hinder transportation to clinic appointments.  .Services Needed at time of discharge: Y = Yes, Blank = No PT:   OT:   RN:   Equipment:   Other:     LOS: 1 day   Benjie Karvonen, Med Student 09/26/2013, 9:23 AM

## 2013-09-26 NOTE — Care Management Note (Signed)
    Page 1 of 1   09/26/2013     4:29:18 PM CARE MANAGEMENT NOTE 09/26/2013  Patient:  Dustin Luna,Dustin Luna   Account Number:  [id number]  Date Initiated:  09/26/2013  Documentation initiated by:  Letha CapeAYLOR,Briget Shaheed  Subjective/Objective Assessment:   dx hypoxemia  admit as observation- from home     Action/Plan:   Anticipated DC Date:  09/26/2013   Anticipated DC Plan:  HOME/SELF CARE      DC Planning Services  CM consult  MATCH Program      Choice offered to / List presented to:             Status of service:  Completed, signed off Medicare Important Message given?   (If response is "NO", the following Medicare IM given date fields will be blank) Date Medicare IM given:   Medicare IM given by:   Date Additional Medicare IM given:   Additional Medicare IM given by:    Discharge Disposition:  HOME/SELF CARE  Per UR Regulation:  Reviewed for med. necessity/level of care/duration of stay  If discussed at Long Length of Stay Meetings, dates discussed:    Comments:  09/26/13 1621 Letha Capeeborah Torre Pikus RN, BSN [phone] NCM informed by RN that MD is now planning to dc patient, patient is down in 2 d echo.  Patient has no insurance. NCM called MD, asked who will the patient be following up with them , he states patient will follow up with internal medicine clinic.  Looks like patient will be dc on abx. NCM gave Match letter to floor RN so patient can afford the abx.  MD just informed NCM that patient will go in am, they need to wait on 2 d echo results.

## 2013-09-26 NOTE — Progress Notes (Signed)
I provided the pt with the matching program paperwork from Care Manager. Pt produced an insurance card indicating he has insurance with [employer] ([employer] that he signed up for through the [employer] marketplace. It shows effective Feb 2015.

## 2013-09-26 NOTE — Progress Notes (Signed)
Echo Lab  2D Echocardiogram completed.  Jeaneane Adamec L Anna Beaird, RDCS 09/26/2013 4:18 PM

## 2013-09-26 NOTE — Discharge Summary (Signed)
Name: Dustin Luna MRN: [id number] DOB: 08/09/1970 43 y.o. PCP: No Pcp Per Patient  Date of Admission: 09/25/2013  5:27 AM Date of Discharge: 09/27/2013 Attending Physician: Aletta EdouardShilpa Bhardwaj, MD  Discharge Diagnosis:  Principal Problem:   Hypoxemia requiring supplemental oxygen Active Problems:   HTN (hypertension)   Renal insufficiency  Discharge Medications:   Medication List         azithromycin 250 MG tablet  Commonly known as:  ZITHROMAX  Take 1 tablet (250 mg total) by mouth daily.     gemfibrozil 600 MG tablet  Commonly known as:  LOPID  Take 1 tablet (600 mg total) by mouth 2 (two) times daily before a meal.     ipratropium-albuterol 0.5-2.5 (3) MG/3ML Soln  Commonly known as:  DUONEB  Take 3 mLs by nebulization every 6 (six) hours as needed.        Disposition and follow-up:   [facility]ustin [facility].  At the hospital follow up visit please address:  1.  Resolution of symptoms  2.  Labs / imaging needed at time of follow-up: consider stress test, PFT v high contract chest CT  3.  Pending labs/ test needing follow-up: mycoplasma antibody IgM, final BCx x 2  Follow-up Appointments: Follow-up Information   Follow up with Luisa DagoModing, [place], MD On 10/05/2013. (@ 9:15 am)    Specialty:  Internal Medicine   Contact information:   [address] [place] [address] [phone]       Discharge Instructions: Discharge Instructions   Diet - low sodium heart healthy    Complete by:  As directed      Increase activity slowly    Complete by:  As directed            Procedures Performed:  X-ray Chest Pa And Lateral   09/26/2013   CLINICAL DATA:  Shortness of breath. Possible community acquired pneumonia.  EXAM: CHEST  2 VIEW  COMPARISON:  Chest x-ray 09/25/2013.  FINDINGS: Lung volumes are low. No consolidative airspace disease. No pleural effusions. No pneumothorax. No pulmonary nodule or mass  noted. Pulmonary vasculature and the cardiomediastinal silhouette are within normal limits.  IMPRESSION: 1. Low lung volumes without radiographic evidence of acute cardiopulmonary disease.   Electronically Signed   By: Trudie Reedaniel  Entrikin M.D.   On: 09/26/2013 08:14   Dg Chest 2 View  09/25/2013   CLINICAL DATA:  Shortness of breath, cough.  EXAM: CHEST  2 VIEW  COMPARISON:  None available  FINDINGS: There is mild cardiomegaly. Mediastinal silhouette within normal limits.  The lungs are hypoinflated. Patchy bibasilar opacities are present, favored to reflect atelectasis related to shallow lung inflation, although possible infiltrates are not entirely excluded, particularly at the medial right lung base. No pulmonary edema or pleural effusion. No pneumothorax.  No acute osseous abnormality identified.  IMPRESSION: 1. Shallow lung inflation with patchy bibasilar opacities. While atelectasis is favored, possible superimposed infiltrates are not entirely excluded, particularly at the right lung base. 2. Cardiomegaly without pulmonary edema or pleural effusion.   Electronically Signed   By: Rise MuBenjamin  McClintock M.D.   On: 09/25/2013 06:46    2D Echo: Left ventricle: The cavity size was normal. Wall thickness wasincreased in a pattern of mild LVH. Systolic function was mildlyreduced. The estimated ejection fraction was in the range of 45%to 50%. Wall motion was normal; there were no regional wall motion abnormalities. Doppler parameters are consistent with abnormal  left ventricular relaxation (grade 1 diastolic dysfunction).  Admission HPI: Pt is a 43 yo M w/ PMH HTN and possibly asthma presents with worsening SOB and chest pain.   He states that he has been experiencing left sided chest pain and SOB for the past month. The pain is under his left breast and is primarily throbbing in nature but is occasionally sharp and can be a quick pain or can last for minutes but is improved when he holds his breath. His SOB  occurs esp with exertion, while walking up the stairs or with exertion. He sleeps on 2 pillow but is able to lie flat to sleep and denies any peripheral edema. He denies any fevers, but his wife states that he has been having chills and rigors. He states that he has had a runny nose, nasal congestion, and productive cough with yellow mucus for the past 2 weeks, and has been more lethargic over the past month with his SOB/DOE. He denies any smoking history. He states that his daughter had been sick with a cold about 1 month ago.   Of note, he states that about 5 ys ago he was experiencing similar SOB and DOE and was given an albuterol inhaler to try, but he states that he may have used it 1-2x and is unsure if it helped.   He denies any person history of blood clots, but states that his father did have a history of clots. He states that his father and his brother both hve a h/o CAD and had stenting in their 60-60s.    Hospital Course by problem list: Principal Problem:   Hypoxemia requiring supplemental oxygen Active Problems:   HTN (hypertension)   Renal insufficiency   #Atypical chest pain likely secondary to chronic bronchitis: The etiology of Dustin Luna chest pain is unclear. He had negative troponin x 3 with unchanged EKG during his stay. He was followed by cardiology who thought ACS was  Unlikely and ordered an echo that demonstrated EF 45-50% with grade 1 diastolic dysfunction. Further cardiac workup was not needed as inpatient but stress test can be considered as outpatient. His intial CXR had patchy bibasilar opacities and he was started on azithromycin and rocephin for CAP coverage. PE seemed unlikely and d-dimer was negative. A repeat CXR on 7/21 did not show consolidation and procalcitonin was <0.1 suggesting he did not have a bacterial pneumonia and rocephin was discontinued. He was afebrile without a leukocytosis during his stay. 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 He will go home with azithromycin 250 mg po daily x 5 days for 7 days total abx. We recommend considering high resolution chest CT in an outpatient setting to follow up on possible fibrosis as well as PFT, stress test in an outpatient setting to further rule out cardiac etiology and get a baseline.  #Asthma: Patient has rarely used albuterol in the past, but asthma is being considered as an underlying etiology for shortness of breath. Currently O2 sat 95-96% on RA. Patient received two dosages of Ipratropium-Albuterol Duoneb 3mL. Shortness of breath has resolved at time of discharge. Patient refused peak flow evaluation which should be assessed as an outpatient.  #Renal insufficiency: Cr 1.3 on admission down to 1.23 on 7/21. He received NS @ 125 cc/hr.  #Hypertriglyceridemia: As part of his cardiac workup, Dustin Luna  had a lipid panel demonstrating high triglycerides (284) and low HDL (34) on 09/25/2013. Patient was begun on Gemfibrozil 600 mg po bid.  #Presyncopal episodes: Patient endorsed history of tunnel vision on exertion or when laughing very hard. No workup was done in the hospital, etiology is likely vasovagal, but consider workup of paroxysmal atrial fibrillation and perhaps an event monitor in an outpatient setting.   Discharge Vitals:   BP 115/75  Pulse 71  Temp(Src) 97.6 F (36.4 C) (Oral)  Resp 18  Ht 5\' 10"  (1.778 m)  Wt 232 lb 9.6 oz (105.507 kg)  BMI 33.37 kg/m2  SpO2 96%  Discharge Labs:  CBC    Component Value Date/Time   WBC 5.1 09/26/2013 0452   RBC 5.00 09/26/2013 0452   HGB 15.1 09/26/2013 0452   HCT 44.3 09/26/2013 0452   PLT 181 09/26/2013 0452   MCV 88.6 09/26/2013 0452   MCH 30.2 09/26/2013 0452   MCHC 34.1 09/26/2013 0452   RDW 12.1 09/26/2013 0452    BMET    Component Value Date/Time   NA 142 09/26/2013 0452   K 4.3  09/26/2013 0452   CL 102 09/26/2013 0452   CO2 27 09/26/2013 0452   GLUCOSE 82 09/26/2013 0452   BUN 12 09/26/2013 0452   CREATININE 1.23 09/26/2013 0452   CALCIUM 8.6 09/26/2013 0452   GFRNONAA 71* 09/26/2013 0452   GFRAA 82* 09/26/2013 0452   Lipid Panel     Component Value Date/Time   CHOL 150 09/25/2013 0935   TRIG 284* 09/25/2013 0935   HDL 34* 09/25/2013 0935   CHOLHDL 4.4 09/25/2013 0935   VLDL 57* 09/25/2013 0935   LDLCALC 59 09/25/2013 0935   HgbA1c 4.9 Troponin-I negative x 3 proBNP 7.7 UDS negative HIV negative D-dimer <0.27 legionella, strep pneumo antigen negative procalcitonin <0.10  7/20 EKG: Sinus rhythm with ST changes in V2-4. T-wave inversion in III. QTc normal. No previous EKG for comparision.    Results for orders placed during the hospital encounter of 09/25/13  CULTURE, BLOOD (ROUTINE X 2)     Status: None   Collection Time    09/25/13  9:35 AM      Result Value Ref Range Status   Specimen Description BLOOD RIGHT ANTECUBITAL   Final   Special Requests     Final   Value: BOTTLES DRAWN AEROBIC AND ANAEROBIC BLUE 10CC RED 5CC   Culture  Setup Time     Final   Value: 09/25/2013 16:13     Performed at [facility]   Culture     Final   Value:        BLOOD CULTURE RECEIVED NO GROWTH TO DATE CULTURE WILL BE HELD FOR 5 DAYS BEFORE ISSUING A FINAL NEGATIVE REPORT     Performed at [facility]   Report Status PENDING   Incomplete  CULTURE, BLOOD (ROUTINE X 2)     Status: None   Collection Time    09/25/13  9:40 AM      Result Value Ref Range Status   Specimen Description BLOOD LEFT ANTECUBITAL   Final   Special Requests BOTTLES DRAWN AEROBIC ONLY 10CC   Final   Culture  Setup Time     Final   Value: 09/25/2013 16:13     Performed at [facility]   Culture     Final   Value:        BLOOD CULTURE RECEIVED NO GROWTH TO DATE CULTURE WILL BE  HELD FOR 5 DAYS BEFORE ISSUING A FINAL NEGATIVE REPORT     Performed at [facility]    Report Status PENDING   Incomplete      Signed: Lorenda Hatchet, MD 09/27/2013, 11:56 AM    Services Ordered on Discharge: none Equipment Ordered on Discharge: none

## 2013-09-26 NOTE — H&P (Signed)
INTERNAL MEDICINE TEACHING ATTENDING NOTE  Day 1 of stay  Patient name: Dustin Luna  MRN: [id number] Date of birth: 09/25/1970   43 y.o.african american man, who comes for chest pain - 51 month old history; with exertion or deep breath, 4-5/10, located over precordium, no radiation, resolves spontaneously in a few seconds. He denies palpitations. He also endorses feeling dizzy/lightheaded and blacking out when he exerts or laughs really hard - to the extent of feeling pre-syncopal (has to hold something to steady himself). He denies any headaches, transient blindness episodes, weakness or paralysis of limbs. He denies any sickness prior to the onset of chest pain however does say his daughter had a URI 1 month ago and he appears to have one now. He has questionable history of asthma, and he has been prescribed an inhaler but he does not use it. However, he has exertional dysphea for a past many years - no PND, no orthopnea. He uses 2 pillows to sleep but can lie down flat without any problems. He denies fever, but endorses chills. He has been tested HIV negative. He is a non-smoker.  His vitals are stable. The patient is not wearing oxygen currently and appears comfortable. On exam, he is alert and oriented, has no cervical lymphadenopathy, has clear chest to auscultation, has benign abdomen with positive bowel sounds. His cardiac exam reveals regular regular heart sounds, no murmurs, no rubs. He is not tender to palpation over the precordium. His extremities are well perfused, warm to touch. No focal neurological deficits.   Reviewed chart, imaging and EKG.   Assessment and Plan                                                                        Shortness of breath - Differential diagnosis could be broad given current history and inconclusive tests. Working diagnosis is community acquired pneumonia however the Xray does not typically represent that. I have asked my team to do a procalcitonin and  CRP to assess the presence of bacterial toxins. Asthma could be an underlying etiology, although the patient denies having that history. We will do a peak flow rate to evaluate. If it is below 200, we will start steroids. CHF is unlikely given low pro-BNP. MI is ruled out. We will consider a CT scan chest/or CTA for PE if the patient does not show any improvement. The other DDx is pericarditis given pleuritic chest pain and non-specific EKG findings on first EKG, however we cannot be certain. I agree with doing ECHO. We will also do a CR. Chronic bronchitis on asthma exacerbation - possible.    Laughing pre-syncope? - Laugh syncope or presyncope is a very rare disorder. Could a possible explanation also be paroxysmal atrial fibrillation? Unsure. Echo is being done. We will follow up.      I have seen and evaluated this patient and discussed it with my IM resident team - Drs Dionicio StallSadek, Rothman and Rivet.  Please see the rest of the plan per resident note from today.   Dustin Luna 09/26/2013, 10:49 AM.

## 2013-09-26 NOTE — Progress Notes (Signed)
  I have seen and examined the patient, and reviewed the daily progress note by Bing ReeKatie Paul, MS 3 and discussed the care of the patient with them. Please see my progress note from 09/26/2013 for further details regarding assessment and plan.    Signed:  Lorenda HatchetAdam L Latressa Harries, MD 09/26/2013, 11:54 AM

## 2013-09-26 NOTE — Progress Notes (Signed)
RT note; patient has been refusing his scheduled Duo nebs and does not want to be waken up. RT completed patient assessment and changed scheduled treatments to Q6 prn only.

## 2013-09-26 NOTE — Progress Notes (Signed)
Subjective: Dustin Luna had no acute events overnight. He was resting comfortably in bed and said he had no complaints and felt fine. Specifically, he denies any chest pain, SOB, cough, fevers, chills, sweats, palpitations, nausea, or emesis.  Objective: Vital signs in last 24 hours: Filed Vitals:   09/25/13 1547 09/25/13 2039 09/25/13 2106 09/26/13 0530  BP:   124/87 121/83  Pulse:  88 82 67  Temp:   98.7 F (37.1 C) 98.3 F (36.8 C)  TempSrc:   Oral Oral  Resp:  18 20 18   Height:      Weight:      SpO2: 96% 96% 95% 96%   Weight change: -3 lb (-1.361 kg)  Intake/Output Summary (Last 24 hours) at 09/26/13 1016 Last data filed at 09/25/13 1950  Gross per 24 hour  Intake    240 ml  Output      0 ml  Net    240 ml   BP 121/83  Pulse 67  Temp(Src) 98.3 F (36.8 C) (Oral)  Resp 18  Ht 5\' 10"  (1.778 m)  Wt 227 lb (102.967 kg)  BMI 32.57 kg/m2  SpO2 96%  General Appearance:    Alert, cooperative, no distress, appears stated age  HEENT:    Normocephalic, without obvious abnormality, atraumatic, PERRL, EOMI, MMM  Back:     Symmetric, no curvature  Lungs:     Clear to auscultation bilaterally, respirations unlabored  Chest wall:    No tenderness or deformity  Heart:    Regular rate and rhythm, S1 and S2 normal, no murmur, rub   or gallop  Abdomen:     Soft, non-tender, bowel sounds active all four quadrants,    no masses, no organomegaly  Extremities:   Extremities normal, atraumatic, no cyanosis or edema  Pulses:   2+ and symmetric radial and DP pulses   Lab Results: Basic Metabolic Panel:  Recent Labs Lab 09/25/13 0935 09/26/13 0452  NA 140 142  K 4.8 4.3  CL 100 102  CO2 22 27  GLUCOSE 96 82  BUN 11 12  CREATININE 1.01 1.23  CALCIUM 9.1 8.6   Liver Function Tests:  Recent Labs Lab 09/25/13 0935  AST 38*  ALT 38  ALKPHOS 81  BILITOT 0.5  PROT 8.5*  ALBUMIN 4.1   No results found for this basename: LIPASE, AMYLASE,  in the last 168 hours No  results found for this basename: AMMONIA,  in the last 168 hours CBC:  Recent Labs Lab 09/25/13 0616 09/25/13 0647 09/26/13 0452  WBC 3.6*  --  5.1  HGB 15.8 16.7 15.1  HCT 45.3 49.0 44.3  MCV 87.5  --  88.6  PLT 160  --  181   Cardiac Enzymes:  Recent Labs Lab 09/25/13 0935 09/25/13 1501 09/25/13 2029  TROPONINI <0.30 <0.30 <0.30   BNP:  Recent Labs Lab 09/25/13 0616  PROBNP 7.7   D-Dimer:  Recent Labs Lab 09/25/13 0935  DDIMER <0.27   CBG: No results found for this basename: GLUCAP,  in the last 168 hours Hemoglobin A1C:  Recent Labs Lab 09/25/13 0935  HGBA1C 4.9   Fasting Lipid Panel:  Recent Labs Lab 09/25/13 0935  CHOL 150  HDL 34*  LDLCALC 59  TRIG 161*  CHOLHDL 4.4   Thyroid Function Tests: No results found for this basename: TSH, T4TOTAL, FREET4, T3FREE, THYROIDAB,  in the last 168 hours Coagulation: No results found for this basename: LABPROT, INR,  in the last 168  hours Anemia Panel: No results found for this basename: VITAMINB12, FOLATE, FERRITIN, TIBC, IRON, RETICCTPCT,  in the last 168 hours Urine Drug Screen: Drugs of Abuse     Component Value Date/Time   LABOPIA NONE DETECTED 09/25/2013 1403   COCAINSCRNUR NONE DETECTED 09/25/2013 1403   LABBENZ NONE DETECTED 09/25/2013 1403   AMPHETMU NONE DETECTED 09/25/2013 1403   THCU NONE DETECTED 09/25/2013 1403   LABBARB NONE DETECTED 09/25/2013 1403    Alcohol Level: No results found for this basename: ETH,  in the last 168 hours Urinalysis: No results found for this basename: COLORURINE, APPERANCEUR, LABSPEC, PHURINE, GLUCOSEU, HGBUR, BILIRUBINUR, KETONESUR, PROTEINUR, UROBILINOGEN, NITRITE, LEUKOCYTESUR,  in the last 168 hours Misc. Labs: Strep pneumo negative, legionella pending HIV negative  Lipid Panel     Component Value Date/Time   CHOL 150 09/25/2013 0935   TRIG 284* 09/25/2013 0935   HDL 34* 09/25/2013 0935   CHOLHDL 4.4 09/25/2013 0935   VLDL 57* 09/25/2013 0935   LDLCALC  59 09/25/2013 0935    Micro Results: Recent Results (from the past 240 hour(s))  CULTURE, BLOOD (ROUTINE X 2)     Status: None   Collection Time    09/25/13  9:35 AM      Result Value Ref Range Status   Specimen Description BLOOD RIGHT ANTECUBITAL   Final   Special Requests     Final   Value: BOTTLES DRAWN AEROBIC AND ANAEROBIC BLUE 10CC RED 5CC   Culture  Setup Time     Final   Value: 09/25/2013 16:13     Performed at [facility]   Culture     Final   Value:        BLOOD CULTURE RECEIVED NO GROWTH TO DATE CULTURE WILL BE HELD FOR 5 DAYS BEFORE ISSUING A FINAL NEGATIVE REPORT     Performed at [facility]   Report Status PENDING   Incomplete  CULTURE, BLOOD (ROUTINE X 2)     Status: None   Collection Time    09/25/13  9:40 AM      Result Value Ref Range Status   Specimen Description BLOOD LEFT ANTECUBITAL   Final   Special Requests BOTTLES DRAWN AEROBIC ONLY 10CC   Final   Culture  Setup Time     Final   Value: 09/25/2013 16:13     Performed at [facility]   Culture     Final   Value:        BLOOD CULTURE RECEIVED NO GROWTH TO DATE CULTURE WILL BE HELD FOR 5 DAYS BEFORE ISSUING A FINAL NEGATIVE REPORT     Performed at [facility]   Report Status PENDING   Incomplete   Studies/Results: X-ray Chest Pa And Lateral   09/26/2013   CLINICAL DATA:  Shortness of breath. Possible community acquired pneumonia.  EXAM: CHEST  2 VIEW  COMPARISON:  Chest x-ray 09/25/2013.  FINDINGS: Lung volumes are low. No consolidative airspace disease. No pleural effusions. No pneumothorax. No pulmonary nodule or mass noted. Pulmonary vasculature and the cardiomediastinal silhouette are within normal limits.  IMPRESSION: 1. Low lung volumes without radiographic evidence of acute cardiopulmonary disease.   Electronically Signed   By: Trudie Reed M.D.   On: 09/26/2013 08:14   Dg Chest 2 View  09/25/2013   CLINICAL DATA:  Shortness of breath, cough.  EXAM: CHEST  2 VIEW   COMPARISON:  None available  FINDINGS: There is mild cardiomegaly. Mediastinal silhouette within normal  limits.  The lungs are hypoinflated. Patchy bibasilar opacities are present, favored to reflect atelectasis related to shallow lung inflation, although possible infiltrates are not entirely excluded, particularly at the medial right lung base. No pulmonary edema or pleural effusion. No pneumothorax.  No acute osseous abnormality identified.  IMPRESSION: 1. Shallow lung inflation with patchy bibasilar opacities. While atelectasis is favored, possible superimposed infiltrates are not entirely excluded, particularly at the right lung base. 2. Cardiomegaly without pulmonary edema or pleural effusion.   Electronically Signed   By: Rise MuBenjamin  McClintock M.D.   On: 09/25/2013 06:46   Medications: I have reviewed the patient's current medications. Scheduled Meds: . azithromycin  500 mg Oral Q24H  . cefTRIAXone (ROCEPHIN)  IV  1 g Intravenous Q24H  . enoxaparin (LOVENOX) injection  40 mg Subcutaneous Q24H  . sodium chloride  3 mL Intravenous Q12H   Continuous Infusions:  PRN Meds:.ipratropium-albuterol, ondansetron (ZOFRAN) IV, ondansetron Assessment/Plan: Principal Problem:   Hypoxemia requiring supplemental oxygen Active Problems:   HTN (hypertension)   Renal insufficiency  1. Atypical chest pain: The patient denies any symptoms overnight. He was negative troponin x 3 with unchanged EKG. He was seen by cardiology who thinks that a cardiac cause of this pleuritic pain is unlikely. They would like an echo and if this is normal do not think further cardiac workup is necessary . D-dimer was negative so PE unlikely. He is currently being treated for CAP with azithromycin and rocephin but does not have fever, leukocytosis or current cough. He did have prior URI symptoms the past two weeks. Repeat CXR this morning does not show a consolidation. Will obtain procalcitonin and if low will consider d/c abx. 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  -f/u echo -Consider high resolution chest CT -cont azithromycin and rocephin pending procalcitonin level -f/u BCx x 2, sputum cx, gram stain, legionella antigen -mycoplasma antigen  2. Asthma: Patient currently denies any respiratory symptoms and rarely has used albuterol in the past. Currently O2 sat 95-96% on RA. Likely noncontributory. -peak flow and consider steroid if <200 -cont duoneb q6hprn  3. Renal insufficiency: Cr 1.3 on admission down to 1.23 this morning.  -cont to monitor  Dispo: Disposition is deferred at this time, awaiting improvement of current medical problems.  Anticipated discharge in approximately 1-2 day(s).   The patient does not have a current PCP (No Pcp Per Patient) and [facility].  The patient does not have transportation limitations that hinder transportation to clinic appointments.  .Services Needed at time of discharge: Y = Yes, Blank = No PT:   OT:   RN:   Equipment:   Other:     LOS: 1 day   Dustin HatchetAdam L Keyleigh Manninen, MD 09/26/2013, 10:16 AM

## 2013-09-27 MED ORDER — GEMFIBROZIL 600 MG PO TABS: 600.0000 mg | ORAL_TABLET | Freq: Two times a day (BID) | ORAL | Status: DC

## 2013-09-27 MED ORDER — IPRATROPIUM-ALBUTEROL 0.5-2.5 (3) MG/3ML IN SOLN: 3.0000 mL | Freq: Four times a day (QID) | RESPIRATORY_TRACT | Status: DC | PRN

## 2013-09-27 MED ORDER — AZITHROMYCIN 250 MG PO TABS
250.0000 mg | ORAL_TABLET | Freq: Every day | ORAL | Status: DC
Start: 2013-09-27 — End: 2013-10-05

## 2013-09-27 NOTE — ED Provider Notes (Signed)
Medical screening examination/treatment/procedure(s) were performed by non-physician practitioner and as supervising physician I was immediately available for consultation/collaboration.   EKG Interpretation   Date/Time:  Monday September 25 2013 05:31:56 EDT Ventricular Rate:  80 PR Interval:  161 QRS Duration: 81 QT Interval:  374 QTC Calculation: 431 R Axis:   58 Text Interpretation:  Sinus rhythm ST elev, probable normal early repol  pattern No old tracing to compare Confirmed by Sharlette Jansma  MD, Kennedy Bohanon ([id number]) on  09/25/2013 5:41:42 AM       Olivia Mackielga M Kirtan Sada, MD 09/27/13 1244

## 2013-09-27 NOTE — Progress Notes (Signed)
NURSING PROGRESS NOTE  Serita GritKevin E Malicki [id number] Discharge Data: 09/27/2013 1:12 PM Attending Provider: Aletta EdouardShilpa Bhardwaj, MD PCP:No PCP Per Patient     Serita GritKevin E Ege to be D/C'd Home per MD order.  Discussed with the patient the After Visit Summary and all questions fully answered. All IV's discontinued with no bleeding noted. All belongings returned to patient for patient to take home.   Last Vital Signs:  Blood pressure 115/75, pulse 71, temperature 97.6 F (36.4 C), temperature source Oral, resp. rate 18, height 5\' 10"  (1.778 m), weight 105.507 kg (232 lb 9.6 oz), SpO2 96.00%.  Discharge Medication List   Medication List         azithromycin 250 MG tablet  Commonly known as:  ZITHROMAX  Take 1 tablet (250 mg total) by mouth daily.     gemfibrozil 600 MG tablet  Commonly known as:  LOPID  Take 1 tablet (600 mg total) by mouth 2 (two) times daily before a meal.     ipratropium-albuterol 0.5-2.5 (3) MG/3ML Soln  Commonly known as:  DUONEB  Take 3 mLs by nebulization every 6 (six) hours as needed.         Cathlyn Parsonsattha Nariya Neumeyer, RN

## 2013-09-27 NOTE — Progress Notes (Signed)
Subjective: Dustin Luna had no acute events overnight. He was resting comfortably in bed and said he actually had a restful night and had no complaints. He denies any chest pain, SOB, cough, fevers, chills, sweats, palpitations, nausea, or emesis.  Objective: Vital signs in last 24 hours: Filed Vitals:   09/26/13 1356 09/26/13 2020 09/27/13 0619 09/27/13 0621  BP: 131/84 129/84 115/75   Pulse: 77 68 71   Temp: 98.1 F (36.7 C) 97.9 F (36.6 C) 97.6 F (36.4 C)   TempSrc: Oral Oral Oral   Resp: 18 20 18    Height:      Weight:    232 lb 9.6 oz (105.507 kg)  SpO2: 94% 95% 96%    Weight change: 5 lb 9.6 oz (2.54 kg)  Intake/Output Summary (Last 24 hours) at 09/27/13 0849 Last data filed at 09/27/13 0500  Gross per 24 hour  Intake    600 ml  Output      0 ml  Net    600 ml   BP 115/75  Pulse 71  Temp(Src) 97.6 F (36.4 C) (Oral)  Resp 18  Ht 5\' 10"  (1.778 m)  Wt 232 lb 9.6 oz (105.507 kg)  BMI 33.37 kg/m2  SpO2 96%  General Appearance:    Alert, cooperative, no distress, appears stated age  HEENT:    Normocephalic, without obvious abnormality, atraumatic, PERRL, EOMI, MMM  Back:     Symmetric, no curvature  Lungs:     Clear to auscultation bilaterally, respirations unlabored  Chest wall:    No tenderness or deformity  Heart:    Regular rate and rhythm, S1 and S2 normal, no murmur, rub   or gallop  Abdomen:     Soft, non-tender, bowel sounds active all four quadrants,    no masses, no organomegaly  Extremities:   Extremities normal, atraumatic, no cyanosis or edema  Pulses:   2+ and symmetric radial and DP pulses   Lab Results: Basic Metabolic Panel:  Recent Labs Lab 09/25/13 0935 09/26/13 0452  NA 140 142  K 4.8 4.3  CL 100 102  CO2 22 27  GLUCOSE 96 82  BUN 11 12  CREATININE 1.01 1.23  CALCIUM 9.1 8.6   Liver Function Tests:  Recent Labs Lab 09/25/13 0935  AST 38*  ALT 38  ALKPHOS 81  BILITOT 0.5  PROT 8.5*  ALBUMIN 4.1   No results found for  this basename: LIPASE, AMYLASE,  in the last 168 hours No results found for this basename: AMMONIA,  in the last 168 hours CBC:  Recent Labs Lab 09/25/13 0616 09/25/13 0647 09/26/13 0452  WBC 3.6*  --  5.1  HGB 15.8 16.7 15.1  HCT 45.3 49.0 44.3  MCV 87.5  --  88.6  PLT 160  --  181   Cardiac Enzymes:  Recent Labs Lab 09/25/13 0935 09/25/13 1501 09/25/13 2029  TROPONINI <0.30 <0.30 <0.30   BNP:  Recent Labs Lab 09/25/13 0616  PROBNP 7.7   D-Dimer:  Recent Labs Lab 09/25/13 0935  DDIMER <0.27   CBG: No results found for this basename: GLUCAP,  in the last 168 hours Hemoglobin A1C:  Recent Labs Lab 09/25/13 0935  HGBA1C 4.9   Fasting Lipid Panel:  Recent Labs Lab 09/25/13 0935  CHOL 150  HDL 34*  LDLCALC 59  TRIG 161*  CHOLHDL 4.4   Thyroid Function Tests: No results found for this basename: TSH, T4TOTAL, FREET4, T3FREE, THYROIDAB,  in the last 168 hours  Coagulation: No results found for this basename: LABPROT, INR,  in the last 168 hours Anemia Panel: No results found for this basename: VITAMINB12, FOLATE, FERRITIN, TIBC, IRON, RETICCTPCT,  in the last 168 hours Urine Drug Screen: Drugs of Abuse     Component Value Date/Time   LABOPIA NONE DETECTED 09/25/2013 1403   COCAINSCRNUR NONE DETECTED 09/25/2013 1403   LABBENZ NONE DETECTED 09/25/2013 1403   AMPHETMU NONE DETECTED 09/25/2013 1403   THCU NONE DETECTED 09/25/2013 1403   LABBARB NONE DETECTED 09/25/2013 1403    Alcohol Level: No results found for this basename: ETH,  in the last 168 hours Urinalysis: No results found for this basename: COLORURINE, APPERANCEUR, LABSPEC, PHURINE, GLUCOSEU, HGBUR, BILIRUBINUR, KETONESUR, PROTEINUR, UROBILINOGEN, NITRITE, LEUKOCYTESUR,  in the last 168 hours Misc. Labs: Strep pneumo negative, legionella negative HIV negative procalcitonin <0.1 ] Lipid Panel     Component Value Date/Time   CHOL 150 09/25/2013 0935   TRIG 284* 09/25/2013 0935   HDL 34*  09/25/2013 0935   CHOLHDL 4.4 09/25/2013 0935   VLDL 57* 09/25/2013 0935   LDLCALC 59 09/25/2013 0935    Micro Results: Recent Results (from the past 240 hour(s))  CULTURE, BLOOD (ROUTINE X 2)     Status: None   Collection Time    09/25/13  9:35 AM      Result Value Ref Range Status   Specimen Description BLOOD RIGHT ANTECUBITAL   Final   Special Requests     Final   Value: BOTTLES DRAWN AEROBIC AND ANAEROBIC BLUE 10CC RED 5CC   Culture  Setup Time     Final   Value: 09/25/2013 16:13     Performed at [facility]   Culture     Final   Value:        BLOOD CULTURE RECEIVED NO GROWTH TO DATE CULTURE WILL BE HELD FOR 5 DAYS BEFORE ISSUING A FINAL NEGATIVE REPORT     Performed at [facility]   Report Status PENDING   Incomplete  CULTURE, BLOOD (ROUTINE X 2)     Status: None   Collection Time    09/25/13  9:40 AM      Result Value Ref Range Status   Specimen Description BLOOD LEFT ANTECUBITAL   Final   Special Requests BOTTLES DRAWN AEROBIC ONLY 10CC   Final   Culture  Setup Time     Final   Value: 09/25/2013 16:13     Performed at [facility]   Culture     Final   Value:        BLOOD CULTURE RECEIVED NO GROWTH TO DATE CULTURE WILL BE HELD FOR 5 DAYS BEFORE ISSUING A FINAL NEGATIVE REPORT     Performed at [facility]   Report Status PENDING   Incomplete   Studies/Results: X-ray Chest Pa And Lateral   09/26/2013   CLINICAL DATA:  Shortness of breath. Possible community acquired pneumonia.  EXAM: CHEST  2 VIEW  COMPARISON:  Chest x-ray 09/25/2013.  FINDINGS: Lung volumes are low. No consolidative airspace disease. No pleural effusions. No pneumothorax. No pulmonary nodule or mass noted. Pulmonary vasculature and the cardiomediastinal silhouette are within normal limits.  IMPRESSION: 1. Low lung volumes without radiographic evidence of acute cardiopulmonary disease.   Electronically Signed   By: Trudie Reed M.D.   On: 09/26/2013 08:14    7/21 echo  Study Conclusions  - Left ventricle: The cavity size was normal. Wall thickness was increased in a  pattern of mild LVH. Systolic function was mildly reduced. The estimated ejection fraction was in the range of 45% to 50%. Wall motion was normal; there were no regional wall motion abnormalities. Doppler parameters are consistent with abnormal left ventricular relaxation (grade 1 diastolic dysfunction).  Medications: I have reviewed the patient's current medications. Scheduled Meds: . azithromycin  500 mg Oral Q24H  . enoxaparin (LOVENOX) injection  40 mg Subcutaneous Q24H  . gemfibrozil  600 mg Oral BID AC  . sodium chloride  3 mL Intravenous Q12H   Continuous Infusions:  PRN Meds:.ipratropium-albuterol, ondansetron (ZOFRAN) IV, ondansetron Assessment/Plan: Principal Problem:   Hypoxemia requiring supplemental oxygen Active Problems:   HTN (hypertension)   Renal insufficiency  1. Atypical chest pain: The patient continues to deny any symptoms. He was negative troponin x 3 with unchanged EKG. He was seen by cardiology who thinks that a cardiac cause of this pleuritic pain is unlikely. Echo notable for grade 1 diastolic dysfunction so unlikely contributor and no evidence of effusion. D-dimer was negative so PE unlikely. He is currently being treated for CAP with azithromycin but does not have fever, leukocytosis or current cough. He did have prior URI symptoms the past two weeks. 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 PFT in future would be useful for possible restrictive fibrosis disease as would stress test for exertional component of pain. -Consider high resolution chest CT/PFt in future -consider outpatient stress test for exertional component of pain -cont azithromycin -f/u BCx x 2, sputum cx, gram stain, legionella antigen -f/u mycoplasma antigen  2. Asthma: Patient currently denies any respiratory symptoms and rarely has used albuterol in  the past. Currently O2 sat 95-96% on RA. Likely noncontributory. Patient refused peak flow and should be evaluated as outpatient -cont duoneb q6hprn  3. Renal insufficiency: Cr 1.3 on admission down to 1.23 yesterday. -cont to monitor  Dispo: Disposition is deferred at this time, awaiting improvement of current medical problems.  Anticipated discharge in approximately today.   The patient does not have a current PCP (No Pcp Per Patient) and [facility].  The patient does not have transportation limitations that hinder transportation to clinic appointments.  .Services Needed at time of discharge: Y = Yes, Blank = No PT:   OT:   RN:   Equipment:   Other:     LOS: 2 days   Lorenda HatchetAdam L Heath Tesler, MD 09/27/2013, 8:49 AM

## 2013-09-27 NOTE — Progress Notes (Signed)
Filed Vitals:   09/26/13 1356 09/26/13 2020 09/27/13 0619 09/27/13 0621  BP: 131/84 129/84 115/75   Pulse: 77 68 71   Temp: 98.1 F (36.7 C) 97.9 F (36.6 C) 97.6 F (36.4 C)   TempSrc: Oral Oral Oral   Resp: 18 20 18    Height:      Weight:    232 lb 9.6 oz (105.507 kg)  SpO2: 94% 95% 96%     Intake/Output Summary (Last 24 hours) at 09/27/13 1127 Last data filed at 09/27/13 0900  Gross per 24 hour  Intake    840 ml  Output      0 ml  Net    840 ml   Filed Weights   09/25/13 0541 09/25/13 1006 09/27/13 0621  Weight: 230 lb (104.327 kg) 227 lb (102.967 kg) 232 lb 9.6 oz (105.507 kg)    Subjective Feels well, less SOB. Wants to go home.  Marland Kitchen azithromycin  500 mg Oral Q24H  . enoxaparin (LOVENOX) injection  40 mg Subcutaneous Q24H  . gemfibrozil  600 mg Oral BID AC  . sodium chloride  3 mL Intravenous Q12H      LABS: Basic Metabolic Panel:  Recent Labs  03/10/70 0935 09/26/13 0452  NA 140 142  K 4.8 4.3  CL 100 102  CO2 22 27  GLUCOSE 96 82  BUN 11 12  CREATININE 1.01 1.23  CALCIUM 9.1 8.6   Liver Function Tests:  Recent Labs  09/25/13 0935  AST 38*  ALT 38  ALKPHOS 81  BILITOT 0.5  PROT 8.5*  ALBUMIN 4.1   No results found for this basename: LIPASE, AMYLASE,  in the last 72 hours CBC:  Recent Labs  09/25/13 0616 09/25/13 0647 09/26/13 0452  WBC 3.6*  --  5.1  HGB 15.8 16.7 15.1  HCT 45.3 49.0 44.3  MCV 87.5  --  88.6  PLT 160  --  181   Cardiac Enzymes:  Recent Labs  09/25/13 0935 09/25/13 1501 09/25/13 2029  TROPONINI <0.30 <0.30 <0.30   BNP:  Recent Labs  09/25/13 0616  PROBNP 7.7   D-Dimer:  Recent Labs  09/25/13 0935  DDIMER <0.27   Hemoglobin A1C:  Recent Labs  09/25/13 0935  HGBA1C 4.9   Fasting Lipid Panel:  Recent Labs  09/25/13 0935  CHOL 150  HDL 34*  LDLCALC 59  TRIG 536*  CHOLHDL 4.4   Thyroid Function Tests: No results found for this basename: TSH, T4TOTAL, FREET3, T3FREE, THYROIDAB,   in the last 72 hours   Radiology/Studies:  X-ray Chest Pa And Lateral   09/26/2013   CLINICAL DATA:  Shortness of breath. Possible community acquired pneumonia.  EXAM: CHEST  2 VIEW  COMPARISON:  Chest x-ray 09/25/2013.  FINDINGS: Lung volumes are low. No consolidative airspace disease. No pleural effusions. No pneumothorax. No pulmonary nodule or mass noted. Pulmonary vasculature and the cardiomediastinal silhouette are within normal limits.  IMPRESSION: 1. Low lung volumes without radiographic evidence of acute cardiopulmonary disease.   Electronically Signed   By: Trudie Reed M.D.   On: 09/26/2013 08:14   Echo: Study Conclusions  - Left ventricle: The cavity size was normal. Wall thickness was increased in a pattern of mild LVH. Systolic function was mildly reduced. The estimated ejection fraction was in the range of 45% to 50%. Wall motion was normal; there were no regional wall motion abnormalities. Doppler parameters are consistent with abnormal left ventricular relaxation (grade 1 diastolic dysfunction).  PHYSICAL EXAM General: Well developed, well nourished, in no acute distress. Head: Normocephalic, atraumatic, sclera non-icteric, oropharynx is clear Neck: Negative for carotid bruits. JVD not elevated. No adenopathy Lungs: Clear bilaterally to auscultation without wheezes, rales, or rhonchi. Breathing is unlabored. Heart: RRR S1 S2 without murmurs, rubs, or gallops.  Abdomen: Soft, non-tender, non-distended with normoactive bowel sounds. No hepatomegaly. No rebound/guarding. No obvious abdominal masses. Msk:  Strength and tone appears normal for age. Extremities: No clubbing, cyanosis or edema.  Distal pedal pulses are 2+ and equal bilaterally. Neuro: Alert and oriented X 3. Moves all extremities spontaneously. Psych:  Responds to questions appropriately with a normal affect.  ASSESSMENT AND PLAN: 1. Dyspnea with hypoxia. Improving. I personally reviewed his Echo study  with Dr. Mayford Knifeurner. We both feel that his LV function is normal with an EF of 55%. The apical views are limited due to poor ultrasound penetration and this may have led to underestimation of EF on report. No further cardiac evaluation needed.  Present on Admission:  . Hypoxemia requiring supplemental oxygen . HTN (hypertension) . Renal insufficiency  Signed, Peter SwazilandJordan, MDFACC 09/27/2013 11:27 AM

## 2013-09-28 NOTE — Discharge Summary (Signed)
INTERNAL MEDICINE ATTENDING DISCHARGE COSIGN   I discussed the discharge plan with my resident team. I agree with the discharge documentation and disposition.   Aletta EdouardBHARDWAJ, Aliesha Dolata 09/28/2013, 3:32 PM

## 2013-09-29 LAB — MYCOPLASMA PNEUMONIAE ANTIBODY, IGM: MYCOPLASMA PNEUMO IGM: 62 U/mL (ref <770)

## 2013-10-01 LAB — CULTURE, BLOOD (ROUTINE X 2)
Culture: NO GROWTH
Culture: NO GROWTH

## 2013-10-03 ENCOUNTER — Ambulatory Visit: Payer: Self-pay | Admitting: Internal Medicine

## 2013-10-05 ENCOUNTER — Ambulatory Visit (INDEPENDENT_AMBULATORY_CARE_PROVIDER_SITE_OTHER): Payer: No Typology Code available for payment source | Admitting: Internal Medicine

## 2013-10-05 ENCOUNTER — Ambulatory Visit (HOSPITAL_COMMUNITY)
Admission: RE | Admit: 2013-10-05 | Discharge: 2013-10-05 | Disposition: A | Discharge status: Home / Self Care | Payer: No Typology Code available for payment source | Source: Ambulatory Visit | Attending: Internal Medicine | Admitting: Internal Medicine

## 2013-10-05 ENCOUNTER — Encounter: Payer: Self-pay | Admitting: Internal Medicine

## 2013-10-05 VITALS — BP 121/82 | HR 69 | Temp 96.8°F | Ht 70.0 in | Wt 237.3 lb

## 2013-10-05 DIAGNOSIS — I10 Essential (primary) hypertension: Secondary | ICD-10-CM | POA: Insufficient documentation

## 2013-10-05 DIAGNOSIS — K219 Gastro-esophageal reflux disease without esophagitis: Secondary | ICD-10-CM

## 2013-10-05 DIAGNOSIS — R52 Pain, unspecified: Secondary | ICD-10-CM | POA: Insufficient documentation

## 2013-10-05 DIAGNOSIS — E781 Pure hyperglyceridemia: Secondary | ICD-10-CM | POA: Insufficient documentation

## 2013-10-05 DIAGNOSIS — R0789 Other chest pain: Secondary | ICD-10-CM | POA: Insufficient documentation

## 2013-10-05 DIAGNOSIS — R0602 Shortness of breath: Secondary | ICD-10-CM | POA: Insufficient documentation

## 2013-10-05 DIAGNOSIS — R61 Generalized hyperhidrosis: Secondary | ICD-10-CM | POA: Insufficient documentation

## 2013-10-05 DIAGNOSIS — R42 Dizziness and giddiness: Secondary | ICD-10-CM | POA: Insufficient documentation

## 2013-10-05 DIAGNOSIS — R55 Syncope and collapse: Secondary | ICD-10-CM

## 2013-10-05 DIAGNOSIS — R062 Wheezing: Secondary | ICD-10-CM | POA: Insufficient documentation

## 2013-10-05 MED ORDER — IPRATROPIUM-ALBUTEROL 18-103 MCG/ACT IN AERO: 2.0000 | INHALATION_SPRAY | Freq: Four times a day (QID) | RESPIRATORY_TRACT | Status: DC | PRN

## 2013-10-05 MED ORDER — OMEPRAZOLE 20 MG PO CPDR
20.0000 mg | DELAYED_RELEASE_CAPSULE | Freq: Every day | ORAL | Status: DC
Start: 2013-10-05 — End: 2013-11-10

## 2013-10-05 NOTE — Progress Notes (Signed)
Subjective:    Patient ID: Dustin Luna, male    DOB: 18-Oct-1970, 43 y.o.   MRN: [id number]  HPI Comments: Dustin Luna is a 43 year old man with a history of hypertension and hypertriglyceridemia who presents as a new patient for hospital follow up.  He was recently hospitalized for atypical chest pain that was thought to be secondary to chronic bronchitis, and he was given a 7 day course of azithromycin.  He was experiencing left-sided chest pain and shortness of breath for the past month.  He describes the pain as throbbing but occasionally sharp and it improves when he holds his breath.  The shortness of breath was occuring especially with exertion, such as walking up stairs.  He had a runny nose, nasal congestion, and a productive cough with yellow mucus but no fevers leading up to his hospitalization.  He says that his shortness of breath and chest pain has improved slightly since being in the hospital, and he no longer has a cough.  He completed his course of azithromycin two days ago. Sometimes the chest pain and shortness of breath are related, but sometimes they occur separately.  He says the shortness of breath and chest pain happens approximately every day.  He reports orthopnea occasionally and PND.  When he gets short of breath, he reports occasional wheezing.  He also feels light-headed like he is going to pass out.  5 years ago, he was given an albuterol inhaler to try, but he only tried it 1 or 2 times and is not sure if it helped.  He is a never smoker.  He also reports a burning sensation in his stomach for the last month approximately every day that does not occur at a specific time of day.  However, he thinks it gets better if he eats something.  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  He is currently not employed.     Review of Systems  Constitutional: Positive for diaphoresis. Negative for fever, chills, appetite change, fatigue and unexpected  weight change.  HENT: Negative for congestion and rhinorrhea.   Respiratory: Positive for shortness of breath and wheezing. Negative for cough.   Cardiovascular: Positive for chest pain. Negative for palpitations and leg swelling.  Gastrointestinal: Negative for nausea, vomiting, abdominal pain, diarrhea and constipation.  Endocrine: Negative for polyphagia and polyuria.  Genitourinary: Negative for difficulty urinating.  Musculoskeletal: Negative for arthralgias and myalgias.  Skin: Negative for rash.  Neurological: Positive for light-headedness. Negative for dizziness, weakness, numbness and headaches.  Psychiatric/Behavioral: Negative for dysphoric mood and agitation.       Objective:   Physical Exam  Constitutional: He is oriented to person, place, and time. He appears well-developed and well-nourished. No distress.  HENT:  Head: Normocephalic and atraumatic.  Mouth/Throat: No oropharyngeal exudate.  Eyes: Conjunctivae and EOM are normal. Pupils are equal, round, and reactive to light.  Neck: Normal range of motion. Neck supple.  Cardiovascular: Normal rate, regular rhythm and normal heart sounds.   Pulmonary/Chest: Effort normal and breath sounds normal. No respiratory distress. He has no wheezes. He has no rales.  Abdominal: Soft. Bowel sounds are normal. He exhibits no distension. There is no tenderness.  Musculoskeletal: Normal range of motion. He exhibits no edema and no tenderness.  Neurological: He is alert and oriented to person, place, and time. No cranial nerve deficit.  Skin: Skin is warm and dry. No rash noted. He is not  diaphoretic.  Psychiatric: He has a normal mood and affect.   EKG: normal EKG, normal sinus rhythm, unchanged from previous tracings.  O2 saturations with abulation: >94% on room air.     Assessment & Plan:  Please see problem-based assessment and plan.

## 2013-10-05 NOTE — Assessment & Plan Note (Signed)
Patient has shortness of breath on exertion and reports occasional wheezing.  He has been prescribed albuterol in the past, but he did not use this enough to determine if it was beneficial.  In combination with his chest pain and orthopnea, there is concern that this may be cardiac related, although there is no sign of heart failure on exam, and he did not desaturate with walking.  He was hypoxic on admission to the hospital, so this warrants further work-up.  He was prescribed nebulizer solution, but he does not have a nebulizer.  I will switch him to Combivent inhaler, which will be easier to use and cheaper. -PFTs -Referral to cardiology for stress test. -Combivent inhaler 2 puffs 4 times daily PRN

## 2013-10-05 NOTE — Assessment & Plan Note (Signed)
Throbbing, occasionally sharp pain beneath left pectoral muscle that occurs randomly and at rest.  Does not appear to be linked to shortness of breath, and seems most likely musculoskeletal.  Possibly precordial catch.  EKG normal again today.  Warrants further work-up with stress test given SOB on exertion. -Referral to cardiology for stress test.

## 2013-10-05 NOTE — Patient Instructions (Signed)
Thank you for coming to clinic today Dustin Luna.  General instructions: -The cause of your chest pain and shortness of breath is unclear.  We would like to do some more tests to figure out what is going on. -We will schedule you for pulmonary function tests and refer you to cardiology for a stress test. -Start taking Prilosec 20 mg daily to help with the burning you have been feeling in your stomach. -I switched your nebulizer prescription for an inhaler with the same medications that you can use if you are having trouble breathing. -Please make a follow up appointment to return to clinic in 1 month.  Thank you for bringing your medicines today. This helps us keep you safe from mistakes.

## 2013-10-05 NOTE — Assessment & Plan Note (Signed)
Feels like he is going to pass out when laughing with vision going black and feeling light-headed.  This appears to be situational syncope with laughing triggering a vagal response.  Will defer to cardiology for further work-up. -Consider tilt-table testing.

## 2013-10-05 NOTE — Assessment & Plan Note (Signed)
Patient complains of burning epigastric pain that gets better when he eats something.  He also has atypical chest pain that could potentially be related.  I will start a trial of a PPI. -Omeprazole 20 mg daily.

## 2013-10-05 NOTE — Progress Notes (Signed)
O2 sat 94% upon ambulation.

## 2013-10-06 NOTE — Progress Notes (Signed)
I saw and evaluated the patient at the time of the visit.  I personally confirmed the key portions of the history and exam documented by Dr. Moding and I reviewed pertinent patient test results.  The assessment, diagnosis, and plan were formulated together and I agree with the documentation in the resident's note. 

## 2013-10-16 ENCOUNTER — Ambulatory Visit (HOSPITAL_COMMUNITY)
Admission: RE | Admit: 2013-10-16 | Discharge: 2013-10-16 | Disposition: A | Discharge status: Home / Self Care | Payer: No Typology Code available for payment source | Source: Ambulatory Visit | Attending: Internal Medicine | Admitting: Internal Medicine

## 2013-10-16 DIAGNOSIS — R0602 Shortness of breath: Secondary | ICD-10-CM | POA: Insufficient documentation

## 2013-10-16 LAB — PULMONARY FUNCTION TEST
DL/VA % pred: 151 %
DL/VA: 7.05 ml/min/mmHg/L
DLCO cor % pred: 124 %
DLCO cor: 40.2 ml/min/mmHg
DLCO unc % pred: 124 %
DLCO unc: 40.2 ml/min/mmHg
FEF 25-75 Post: 3 L/sec
FEF 25-75 Pre: 2.24 L/sec
FEF2575-%CHANGE-POST: 34 %
FEF2575-%Pred-Post: 82 %
FEF2575-%Pred-Pre: 61 %
FEV1-%CHANGE-POST: 9 %
FEV1-%PRED-PRE: 75 %
FEV1-%Pred-Post: 82 %
FEV1-Post: 2.91 L
FEV1-Pre: 2.65 L
FEV1FVC-%Change-Post: 3 %
FEV1FVC-%Pred-Pre: 91 %
FEV6-%Change-Post: 6 %
FEV6-%PRED-PRE: 83 %
FEV6-%Pred-Post: 89 %
FEV6-PRE: 3.55 L
FEV6-Post: 3.78 L
FEV6FVC-%Change-Post: 0 %
FEV6FVC-%PRED-POST: 102 %
FEV6FVC-%PRED-PRE: 101 %
FVC-%Change-Post: 5 %
FVC-%Pred-Post: 87 %
FVC-%Pred-Pre: 82 %
FVC-POST: 3.78 L
FVC-PRE: 3.57 L
POST FEV1/FVC RATIO: 77 %
POST FEV6/FVC RATIO: 100 %
Pre FEV1/FVC ratio: 74 %
Pre FEV6/FVC Ratio: 99 %
RV % PRED: 99 %
RV: 1.87 L
TLC % pred: 80 %
TLC: 5.62 L

## 2013-10-16 MED ORDER — ALBUTEROL SULFATE (2.5 MG/3ML) 0.083% IN NEBU
2.5000 mg | INHALATION_SOLUTION | Freq: Once | RESPIRATORY_TRACT | Status: AC
  Administered 2013-10-16: 2.5 mg via RESPIRATORY_TRACT

## 2013-11-10 ENCOUNTER — Encounter: Payer: Self-pay | Admitting: Internal Medicine

## 2013-11-10 ENCOUNTER — Ambulatory Visit (INDEPENDENT_AMBULATORY_CARE_PROVIDER_SITE_OTHER): Payer: No Typology Code available for payment source | Admitting: Internal Medicine

## 2013-11-10 VITALS — BP 116/77 | HR 68 | Temp 98.1°F | Ht 70.0 in | Wt 237.9 lb

## 2013-11-10 DIAGNOSIS — K219 Gastro-esophageal reflux disease without esophagitis: Secondary | ICD-10-CM

## 2013-11-10 DIAGNOSIS — E782 Mixed hyperlipidemia: Secondary | ICD-10-CM | POA: Insufficient documentation

## 2013-11-10 DIAGNOSIS — E781 Pure hyperglyceridemia: Secondary | ICD-10-CM

## 2013-11-10 DIAGNOSIS — R0602 Shortness of breath: Secondary | ICD-10-CM

## 2013-11-10 MED ORDER — GEMFIBROZIL 600 MG PO TABS: 600.0000 mg | ORAL_TABLET | Freq: Two times a day (BID) | ORAL | Status: DC

## 2013-11-10 MED ORDER — OMEPRAZOLE 20 MG PO CPDR: 20.0000 mg | DELAYED_RELEASE_CAPSULE | Freq: Every day | ORAL | Status: DC

## 2013-11-10 NOTE — Patient Instructions (Signed)
We will work on your stress test referral with cardiology.  Please work on losing weight and eating healthy. Your weight is concerning. Keep working with the advisor at the gym.  Use the inhaler whenever you feel short of breathe.  Follow up in 3 months.

## 2013-11-10 NOTE — Progress Notes (Addendum)
Subjective:    Patient ID: Dustin Luna, male    DOB: 12-31-1970, 43 y.o.   MRN: [id number]  HPI  43 year old male comes back here for followup. He was last seen in the clinic for hospital followup with shortness of breath with exertion, chest tightness, GERD. He was told to start PPI for GERD, to get PFT, and also combivent for SOB. He is doing better now in terms of breathing but still gets SOB with climbing the stairs or walking few blocks. When he rests few secs the breathing improves. He hasn't used his inhaler at all.   PFTs done showed FVC 82%, FEV 75%, ratio 91%, DLCO high. Not much improvement post-treatment. Conclusion was minimal obstruction likely due to asthma.    He states he has been gaining weight and is not physically active. Patient is interested in weight loss and states he is working with someone at the gym to help with his diet and exercise.   GERD is under control with PPI. No more symptoms.    Review of Systems  Constitutional: Negative for fever, chills, appetite change and fatigue.  HENT: Negative for congestion, ear discharge, ear pain, facial swelling, hearing loss, sinus pressure, sore throat and tinnitus.   Eyes: Negative for photophobia, pain, discharge and visual disturbance.  Respiratory: Positive for shortness of breath. Negative for cough, chest tightness and wheezing.   Cardiovascular: Negative for chest pain, palpitations and leg swelling.  Gastrointestinal: Negative for nausea, vomiting, abdominal pain, diarrhea, blood in stool and abdominal distention.  Endocrine: Negative.   Genitourinary: Negative for dysuria, hematuria and difficulty urinating.  Musculoskeletal: Negative for back pain, joint swelling, myalgias, neck pain and neck stiffness.  Skin: Negative.   Allergic/Immunologic: Negative.   Neurological: Negative for dizziness, tremors, seizures, syncope, speech difficulty, weakness, light-headedness, numbness and headaches.  Hematological:  Negative.   Psychiatric/Behavioral: Negative.        Objective:   Physical Exam  Constitutional: He is oriented to person, place, and time. He appears well-developed and well-nourished. No distress.  HENT:  Head: Normocephalic and atraumatic.  Right Ear: External ear normal.  Left Ear: External ear normal.  Nose: Nose normal.  Mouth/Throat: Oropharynx is clear and moist.  Eyes: Conjunctivae and EOM are normal. Pupils are equal, round, and reactive to light. Right eye exhibits no discharge. Left eye exhibits no discharge. No scleral icterus.  Neck: Normal range of motion. Neck supple. No JVD present. No thyromegaly present.  Cardiovascular: Normal rate, regular rhythm, S1 normal, S2 normal, normal heart sounds and intact distal pulses.  Exam reveals no gallop and no friction rub.   No murmur heard. Pulmonary/Chest: Effort normal and breath sounds normal. No respiratory distress. He has no wheezes. He has no rales. He exhibits no tenderness.  Abdominal: Soft. Bowel sounds are normal. He exhibits no distension and no mass. There is no tenderness. There is no rebound and no guarding.  Musculoskeletal: Normal range of motion. He exhibits no edema and no tenderness.  Lymphadenopathy:    He has no cervical adenopathy.  Neurological: He is alert and oriented to person, place, and time. He has normal strength and normal reflexes. No cranial nerve deficit or sensory deficit.  Skin: No rash noted. He is not diaphoretic. No erythema. No pallor.  Psychiatric: He has a normal mood and affect. His behavior is normal.        Assessment & Plan:  See problem based a&p.   Attending note:  I saw and evaluated the  patient.  I personally confirmed the key portions of the history and exam documented by Dr. Tasia Catchings and I reviewed pertinent patient test results.  The assessment, diagnosis, and plan were formulated together and I agree with the documentation in the resident's note. Blanch Media,  MD 11/22/13 1:14 PM

## 2013-11-10 NOTE — Assessment & Plan Note (Addendum)
Gets SOB with exertion, reports no wheezing currently. Denies any chest pain when he is SOB. Pulmonary exam is normal. Patient's SOB could either be from mild asthma or from not being physically fit. Cardiac etiology still possible, al though less likely given no chest pain. His BMI is high and he doesn't exercise at all.Last echo was normal with EF 55% per cards note.   PFTs shows mild obstruction likely from mild asthma.  -Will get stress test with cardiology on 12/04/2013, this would also show [place] his exercise capacity.  -Continue combivent as needed.  -asked patient to lose weight. He is working with an Oncologist at [employer] and didn't want help from [facility] here. He said he will start exercising and eating healthy.

## 2013-11-10 NOTE — Assessment & Plan Note (Signed)
Doing well with omeprazole  daily. Continue same dose.

## 2013-11-10 NOTE — Assessment & Plan Note (Signed)
Continue current gemfibrozil  BID. Will check lipid panel again later.  Asked him to lose weight and eat low fat, low salt, healthy diet.

## 2013-12-04 ENCOUNTER — Ambulatory Visit: Payer: No Typology Code available for payment source | Admitting: Cardiology

## 2013-12-06 NOTE — Addendum Note (Signed)
Addended by: Dorie RankPOWERS, Breeanna Galgano E on: 12/06/2013 04:00 PM   Modules accepted: Orders

## 2014-01-05 ENCOUNTER — Encounter: Payer: Self-pay | Admitting: *Deleted

## 2014-01-08 ENCOUNTER — Encounter: Payer: Self-pay | Admitting: Cardiology

## 2014-01-08 ENCOUNTER — Ambulatory Visit (INDEPENDENT_AMBULATORY_CARE_PROVIDER_SITE_OTHER): Payer: No Typology Code available for payment source | Admitting: Cardiology

## 2014-01-08 VITALS — BP 128/90 | HR 67 | Ht 69.0 in | Wt 238.0 lb

## 2014-01-08 DIAGNOSIS — R931 Abnormal findings on diagnostic imaging of heart and coronary circulation: Secondary | ICD-10-CM

## 2014-01-08 DIAGNOSIS — Z0000 Encounter for general adult medical examination without abnormal findings: Secondary | ICD-10-CM

## 2014-01-08 DIAGNOSIS — R079 Chest pain, unspecified: Secondary | ICD-10-CM

## 2014-01-08 LAB — BASIC METABOLIC PANEL
BUN: 14 mg/dL (ref 6–23)
CO2: 26 mEq/L (ref 19–32)
Calcium: 9.1 mg/dL (ref 8.4–10.5)
Chloride: 103 mEq/L (ref 96–112)
Creatinine, Ser: 1.2 mg/dL (ref 0.4–1.5)
GFR: 84.18 mL/min (ref >60.00)
Glucose, Bld: 84 mg/dL (ref 70–99)
Potassium: 4.2 mEq/L (ref 3.5–5.1)
Sodium: 137 mEq/L (ref 135–145)

## 2014-01-08 LAB — LIPID PANEL
Cholesterol: 159 mg/dL (ref 0–200)
HDL: 33 mg/dL — ABNORMAL LOW (ref >39.00)
NonHDL: 126
Total CHOL/HDL Ratio: 5
Triglycerides: 249 mg/dL — ABNORMAL HIGH (ref 0.0–149.0)
VLDL: 49.8 mg/dL — ABNORMAL HIGH (ref 0.0–40.0)

## 2014-01-08 LAB — HEPATIC FUNCTION PANEL
ALT: 32 U/L (ref 0–53)
AST: 29 U/L (ref 0–37)
Albumin: 3.7 g/dL (ref 3.5–5.2)
Alkaline Phosphatase: 59 U/L (ref 39–117)
Bilirubin, Direct: 0.1 mg/dL (ref 0.0–0.3)
Total Bilirubin: 0.8 mg/dL (ref 0.2–1.2)
Total Protein: 7.7 g/dL (ref 6.0–8.3)

## 2014-01-08 LAB — LDL CHOLESTEROL, DIRECT: Direct LDL: 73.7 mg/dL

## 2014-01-08 NOTE — Patient Instructions (Signed)
Your physician recommends that you schedule a follow-up appointment in:   2 MONTHS WITH  DR Delton SeeNELSON   Your physician has requested that you have en exercise stress myoview. For further information please visit [email or web address]. Please follow instruction sheet, as given. Your physician recommends that you return for lab work in:  TODAY   BMET  LIPID LIVER

## 2014-01-08 NOTE — Progress Notes (Signed)
Patient ID: Dustin GritKevin E Santiesteban, male   DOB: 12/02/1970, 43 y.o.   MRN: [id number]     Patient Name: Dustin Luna Date of Encounter: 01/08/2014  Primary Care Provider:  No PCP Per Patient Primary Cardiologist: Lars MassonNELSON, Alyla Pietila H   Problem List   Past Medical History  Diagnosis Date  . Hypertension    Past Surgical History  Procedure Laterality Date  . No past surgeries      Allergies  No Known Allergies  HPI  Pt is a 43 yo M w/ PMH HTN and possibly asthma was admitted to the hospital in July 2015 with worsening SOB and chest pain. He stated that he has been experiencing left sided chest pain and SOB for the past month. The pain is under his left breast and is primarily throbbing in nature but is occasionally sharp and can be a quick pain or can last for minutes but is improved when he holds his breath. His SOB occurs esp with exertion, while walking up the stairs or with exertion. He sleeps on 2 pillow but is able to lie flat to sleep and denies any peripheral edema. He denies any smoking history. Of note, he states that about 5 ys ago he was experiencing similar SOB and DOE and was given an albuterol inhaler to try, but he states that he may have used it 1-2x and is unsure if it helped.  He denies any person history of blood clots, but states that his father did have a history of clots. He states that his father and his brother both hve a h/o CAD and had stenting in their 150-60s.   He was seen by cardiology consult for abnormal left ventricular ejection fraction, however reviewed images of poor quality echocardiogram showed that his left ventricular ejection fraction is most probably normal. ACS was ruled out and it was concluded that his pain is most probably not cardiac in origin. He was sent home on Z-Pak.  The patient is coming today after 3 months, he states that his pain he felt before has resolved, however now he is experiencing new type of chest pain in the left side of his chest  that started on Saturday and has been progressively getting worse. Not related to exertion the pain is significant to the point that a he couldn't sleep. He denies any shortness of breath, orthopnea, PND or lower extremity edema no palpitations or syncope. He is not taking any of the medicines that were prescribed in the hospital.   Home Medications  Prior to Admission medications   Medication Sig Start Date End Date Taking? Authorizing Provider  albuterol-ipratropium (COMBIVENT) 18-103 MCG/ACT inhaler Inhale 2 puffs into the lungs 4 (four) times daily as needed for wheezing. 10/05/13 10/05/14  Luisa DagoEverett Moding, MD  gemfibrozil (LOPID) 600 MG tablet Take 1 tablet (600 mg total) by mouth 2 (two) times daily before a meal. 11/10/13   Tasrif Ahmed, MD  omeprazole (PRILOSEC) 20 MG capsule Take 1 capsule (20 mg total) by mouth daily. 11/10/13 12/11/13  Hyacinth Meekerasrif Ahmed, MD    Family History  Family History  Problem Relation Age of Onset  . Other Father     cardiac stent  . Other Brother     cardiac stent    Social History  History   Social History  . Marital Status: Single    Spouse Name: N/A    Number of Children: N/A  . Years of Education: N/A   Occupational History  . Not  on file.   Social History Main Topics  . Smoking status: Never Smoker   . Smokeless tobacco: Never Used  . Alcohol Use: Yes     Comment: Ocaasional.  . Drug Use: No  . Sexual Activity: Not on file   Other Topics Concern  . Not on file   Social History Narrative     Review of Systems, as per HPI, otherwise negative General:  No chills, fever, night sweats or weight changes.  Cardiovascular:  No chest pain, dyspnea on exertion, edema, orthopnea, palpitations, paroxysmal nocturnal dyspnea. Dermatological: No rash, lesions/masses Respiratory: No cough, dyspnea Urologic: No hematuria, dysuria Abdominal:   No nausea, vomiting, diarrhea, bright red blood per rectum, melena, or hematemesis Neurologic:  No visual  changes, wkns, changes in mental status. All other systems reviewed and are otherwise negative except as noted above.  Physical Exam  Blood pressure 128/90, pulse 67, height 5\' 9"  (1.753 m), weight 238 lb (107.956 kg), SpO2 95 %.  General: Pleasant, NAD Psych: Normal affect. Neuro: Alert and oriented X 3. Moves all extremities spontaneously. HEENT: Normal  Neck: Supple without bruits or JVD. Lungs:  Resp regular and unlabored, CTA. Heart: RRR no s3, s4, or murmurs. Abdomen: Soft, non-tender, non-distended, BS + x 4.  Extremities: No clubbing, cyanosis or edema. DP/PT/Radials 2+ and equal bilaterally.  Labs:  No results for input(s): CKTOTAL, CKMB, TROPONINI in the last 72 hours. Lab Results  Component Value Date   WBC 5.1 09/26/2013   HGB 15.1 09/26/2013   HCT 44.3 09/26/2013   MCV 88.6 09/26/2013   PLT 181 09/26/2013    Lab Results  Component Value Date   DDIMER <0.27 09/25/2013   Invalid input(s): POCBNP    Component Value Date/Time   NA 142 09/26/2013 0452   K 4.3 09/26/2013 0452   CL 102 09/26/2013 0452   CO2 27 09/26/2013 0452   GLUCOSE 82 09/26/2013 0452   BUN 12 09/26/2013 0452   CREATININE 1.23 09/26/2013 0452   CALCIUM 8.6 09/26/2013 0452   PROT 8.5* 09/25/2013 0935   ALBUMIN 4.1 09/25/2013 0935   AST 38* 09/25/2013 0935   ALT 38 09/25/2013 0935   ALKPHOS 81 09/25/2013 0935   BILITOT 0.5 09/25/2013 0935   GFRNONAA 71* 09/26/2013 0452   GFRAA 82* 09/26/2013 0452   Lab Results  Component Value Date   CHOL 150 09/25/2013   HDL 34* 09/25/2013   LDLCALC 59 09/25/2013   TRIG 284* 09/25/2013    Accessory Clinical Findings  Echocardiogram - Left ventricle: The cavity size was normal. Wall thickness wasincreased in a pattern of mild LVH. Systolic function was mildlyreduced. The estimated ejection fraction was in the range of 45%to 50%. Wall motion was normal; there were no regional wall motion abnormalities. Doppler parameters are consistent with abnormal  left ventricular relaxation (grade 1 diastolic dysfunction).  ECG - sinus rhythm, nonspecific ST-T wave abnormalities   ASSESSMENT AND PLAN:  1. Chest pain - appears atypical however significant family history of premature coronary artery disease, untreated hypertriglyceridemia, and possibly impaired left ventricular ejection fraction. Therefore we will order an exercise nuclear stress test to evaluate for ischemia and left ventricular ejection fraction.  2. High triglycerides - the patient didn't take prescribed gemfibrozil 1 he to try diet first, we will recheck his lipids today and adjust her up based on results.  3. Hypertension - controlled  4. Asthma - not actively wheezing, not using albuterol at this point.  5. CKD stage 3 - we  will recheck  Follow up in 2  months   Lars Masson, MD, [facility] 01/08/2014, 9:58 AM

## 2014-01-23 ENCOUNTER — Ambulatory Visit (HOSPITAL_COMMUNITY): Payer: No Typology Code available for payment source | Attending: Cardiology | Admitting: Radiology

## 2014-01-23 DIAGNOSIS — R079 Chest pain, unspecified: Secondary | ICD-10-CM | POA: Diagnosis not present

## 2014-01-23 DIAGNOSIS — R55 Syncope and collapse: Secondary | ICD-10-CM | POA: Diagnosis not present

## 2014-01-23 DIAGNOSIS — R0602 Shortness of breath: Secondary | ICD-10-CM | POA: Diagnosis not present

## 2014-01-23 DIAGNOSIS — E785 Hyperlipidemia, unspecified: Secondary | ICD-10-CM | POA: Insufficient documentation

## 2014-01-23 DIAGNOSIS — I10 Essential (primary) hypertension: Secondary | ICD-10-CM | POA: Diagnosis not present

## 2014-01-23 MED ORDER — TECHNETIUM TC 99M SESTAMIBI GENERIC - CARDIOLITE
11.0000 | Freq: Once | INTRAVENOUS | Status: AC | PRN
  Administered 2014-01-23: 11 via INTRAVENOUS

## 2014-01-23 MED ORDER — TECHNETIUM TC 99M SESTAMIBI GENERIC - CARDIOLITE
33.0000 | Freq: Once | INTRAVENOUS | Status: AC | PRN
  Administered 2014-01-23: 33 via INTRAVENOUS

## 2014-01-23 NOTE — Progress Notes (Signed)
Dustin Luna [facility] SITE 3 NUCLEAR MED [address] [place], [place] [address] [phone]    Cardiology Nuclear Med Study  [name] is a 43 y.o. male     MRN : [id number]     DOB: 06/27/1970  Procedure Date: 01/23/2014  Nuclear Med Background Indication for Stress Test:  Evaluation for Ischemia History:  Abnormal EKG Cardiac Risk Factors: Hypertension and Lipids  Symptoms:  Chest Pain, SOB and Syncope   Nuclear Pre-Procedure Caffeine/Decaff Intake:  7:00pm NPO After: 7:00pm   Lungs:  clear O2 Sat: 97% on room air. IV 0.9% NS with Angio Cath:  22g  IV Site: R Hand  IV Started by:  Cathlyn Parsonsynthia Hasspacher, RN  Chest Size (in):  54 Cup Size: n/a  Height: 5\' 9"  (1.753 m)  Weight:  234 lb (106.142 kg)  BMI:  Body mass index is 34.54 kg/(m^2). Tech Comments:  n/a    Nuclear Med Study 1 or 2 day study: 1 day  Stress Test Type:  Stress  Reading MD: n/a  Order Authorizing Provider:  Wyvonna PlumKatarina Nelson,MD  Resting Radionuclide: Technetium 6370m Sestamibi  Resting Radionuclide Dose: 11.0 mCi   Stress Radionuclide:  Technetium 2370m Sestamibi  Stress Radionuclide Dose: 33.0 mCi           Stress Protocol Rest HR: 61 Stress HR: 160  Rest BP: 110/78 Stress BP: 163/89  Exercise Time (min): 10:15 METS: 12.10   Predicted Max HR: 177 bpm % Max HR: 90.4 bpm Rate Pressure Product: [id number]   Dose of Adenosine (mg):  n/a Dose of Lexiscan: n/a mg  Dose of Atropine (mg): n/a Dose of Dobutamine: n/a mcg/kg/min (at max HR)  Stress Test Technologist: Milana NaSabrina Williams, EMT-P  Nuclear Technologist:  Kerby NoraElzbieta Kubak, CNMT     Rest Procedure:  Myocardial perfusion imaging was performed at rest 45 minutes following the intravenous administration of Technetium 2970m Sestamibi. Rest ECG: NSR - Normal EKG  Stress Procedure:  The patient exercised on the treadmill utilizing the Bruce Protocol for 10:15 minutes. The patient stopped due to fatigue and denied any chest pain.  Technetium 6870m Sestamibi was  injected at peak exercise and myocardial perfusion imaging was performed after a brief delay. Stress ECG: No significant change from baseline ECG  QPS Raw Data Images:  Normal; no motion artifact; normal heart/lung ratio. Stress Images:  Normal homogeneous uptake in all areas of the myocardium. Rest Images:  Normal homogeneous uptake in all areas of the myocardium. Subtraction (SDS):  No evidence of ischemia. Transient Ischemic Dilatation (Normal <1.22):  0.93 Lung/Heart Ratio (Normal <0.45):  0.31  Quantitative Gated Spect Images QGS EDV:  110 ml QGS ESV:  54 ml  Impression Exercise Capacity:  Excellent exercise capacity. BP Response:  Normal blood pressure response. Clinical Symptoms:  No significant symptoms noted. ECG Impression:  No significant ST segment change suggestive of ischemia. Comparison with Prior Nuclear Study: No previous nuclear study performed  Overall Impression:  Normal stress nuclear study.  LV Ejection Fraction: 57%.  LV Wall Motion:  NL LV Function; NL Wall Motion  Signed: [name], MD [facility] 01/23/2014

## 2014-01-24 ENCOUNTER — Telehealth: Payer: Self-pay | Admitting: Cardiology

## 2014-01-24 NOTE — Telephone Encounter (Signed)
New MSG    Patient returning call from today

## 2014-01-24 NOTE — Telephone Encounter (Signed)
Pt notified of normal stress test results, no ischemia, no prior infarct, normal LVEF per Dr Delton SeeNelson.  Pt verbalized understanding and pleased with these results.

## 2014-03-19 ENCOUNTER — Ambulatory Visit: Payer: No Typology Code available for payment source | Admitting: Cardiology

## 2014-03-21 ENCOUNTER — Encounter: Payer: Self-pay | Admitting: Cardiology

## 2016-01-08 ENCOUNTER — Ambulatory Visit (HOSPITAL_COMMUNITY)
Admission: EM | Admit: 2016-01-08 | Discharge: 2016-01-08 | Disposition: A | Discharge status: Home / Self Care | Payer: PRIVATE HEALTH INSURANCE | Attending: Family Medicine | Admitting: Family Medicine

## 2016-01-08 ENCOUNTER — Encounter (HOSPITAL_COMMUNITY): Payer: Self-pay | Admitting: *Deleted

## 2016-01-08 DIAGNOSIS — H1131 Conjunctival hemorrhage, right eye: Secondary | ICD-10-CM | POA: Diagnosis not present

## 2016-01-08 NOTE — ED Triage Notes (Signed)
Pt     Reports  Sensation  Of  Redness  To  r  Eye       denys  Any  Injury  denys  Any    Blurred  Vision     Pt  In no  Acute  Distress

## 2016-01-08 NOTE — ED Provider Notes (Signed)
[facility]    CSN: [id number] Arrival date & time: 01/08/16  1734     History   Chief Complaint Chief Complaint  Patient presents with  . Eye Problem    HPI Dustin Luna is a 45 y.o. male.   The history is provided by the patient and the spouse.  Eye Problem  Location:  Right eye Quality:  Dull Severity:  Mild Onset quality:  Sudden Progression:  Unchanged Chronicity:  New Context comment:  At a football game yest and was rubbing eye a lot, no asa use. Relieved by:  None tried Worsened by:  Nothing Ineffective treatments:  None tried Associated symptoms: redness   Associated symptoms: no blurred vision, no crusting, no decreased vision, no discharge, no double vision, no itching and no photophobia     Past Medical History:  Diagnosis Date  . Hypertension     Patient Active Problem List   Diagnosis Date Noted  . Chest pain at rest 01/08/2014  . Abnormal echocardiogram 01/08/2014  . High triglycerides 11/10/2013  . Shortness of breath 10/05/2013  . GERD (gastroesophageal reflux disease) 10/05/2013  . Situational syncope 10/05/2013  . Atypical chest pain 10/05/2013  . HTN (hypertension) 09/25/2013  . Renal insufficiency 09/25/2013    Past Surgical History:  Procedure Laterality Date  . NO PAST SURGERIES         Home Medications    Prior to Admission medications   Medication Sig Start Date End Date Taking? Authorizing Provider  albuterol-ipratropium (COMBIVENT) 18-103 MCG/ACT inhaler Inhale 2 puffs into the lungs 4 (four) times daily as needed for wheezing. 10/05/13 10/05/14  Donavan FoilEverett J Moding, MD  gemfibrozil (LOPID) 600 MG tablet Take 1 tablet (600 mg total) by mouth 2 (two) times daily before a meal. 11/10/13   Tasrif Ahmed, MD  omeprazole (PRILOSEC) 20 MG capsule Take 1 capsule (20 mg total) by mouth daily. 11/10/13 12/11/13  Hyacinth Meekerasrif Ahmed, MD    Family History Family History  Problem Relation Age of Onset  . Other Father     cardiac stent   . Other Brother     cardiac stent    Social History Social History  Substance Use Topics  . Smoking status: Never Smoker  . Smokeless tobacco: Never Used  . Alcohol use Yes     Comment: Ocaasional.     Allergies   Review of patient's allergies indicates no known allergies.   Review of Systems Review of Systems  Constitutional: Negative.   HENT: Negative.   Eyes: Positive for redness. Negative for blurred vision, double vision, photophobia, pain, discharge, itching and visual disturbance.  All other systems reviewed and are negative.    Physical Exam Triage Vital Signs ED Triage Vitals [01/08/16 1806]  Enc Vitals Group     BP 138/95     Pulse Rate 67     Resp 14     Temp 99.2 F (37.3 C)     Temp Source Oral     SpO2 96 %     Weight      Height      Head Circumference      Peak Flow      Pain Score      Pain Loc      Pain Edu?      Excl. in GC?    No data found.   Updated Vital Signs BP 138/95 (BP Location: Left Arm)   Pulse 67   Temp 99.2 F (37.3 C) (Oral)  Resp 14   SpO2 96%   Visual Acuity Right Eye Distance: 20/40 Left Eye Distance: 20/30 Bilateral Distance: 20/30  Right Eye Near:   Left Eye Near:    Bilateral Near:     Physical Exam  Constitutional: He appears well-developed and well-nourished. No distress.  HENT:  Head: Normocephalic and atraumatic.  Eyes: EOM and lids are normal. Pupils are equal, round, and reactive to light. Lids are everted and swept, no foreign bodies found. Right eye exhibits no discharge. Left eye exhibits no discharge. Right conjunctiva is not injected. Right conjunctiva has a hemorrhage. Left conjunctiva is not injected. Left conjunctiva has no hemorrhage.    Nursing note and vitals reviewed.    UC Treatments / Results  Labs (all labs ordered are listed, but only abnormal results are displayed) Labs Reviewed - No data to display  EKG  EKG Interpretation None       Radiology No results  found.  Procedures Procedures (including critical care time)  Medications Ordered in UC Medications - No data to display   Initial Impression / Assessment and Plan / UC Course  I have reviewed the triage vital signs and the nursing notes.  Pertinent labs & imaging results that were available during my care of the patient were reviewed by me and considered in my medical decision making (see chart for details).  Clinical Course      Final Clinical Impressions(s) / UC Diagnoses   Final diagnoses:  None    New Prescriptions New Prescriptions   No medications on file     Linna HoffJames D Kalila Adkison, MD 01/08/16 Rickey [id number]

## 2016-01-24 ENCOUNTER — Ambulatory Visit (HOSPITAL_COMMUNITY)
Admission: EM | Admit: 2016-01-24 | Discharge: 2016-01-24 | Disposition: A | Discharge status: Home / Self Care | Payer: PRIVATE HEALTH INSURANCE | Attending: Family Medicine | Admitting: Family Medicine

## 2016-01-24 ENCOUNTER — Encounter (HOSPITAL_COMMUNITY): Payer: Self-pay | Admitting: *Deleted

## 2016-01-24 DIAGNOSIS — K219 Gastro-esophageal reflux disease without esophagitis: Secondary | ICD-10-CM | POA: Diagnosis not present

## 2016-01-24 DIAGNOSIS — I10 Essential (primary) hypertension: Secondary | ICD-10-CM | POA: Diagnosis not present

## 2016-01-24 DIAGNOSIS — R1030 Lower abdominal pain, unspecified: Secondary | ICD-10-CM | POA: Insufficient documentation

## 2016-01-24 DIAGNOSIS — N289 Disorder of kidney and ureter, unspecified: Secondary | ICD-10-CM | POA: Diagnosis not present

## 2016-01-24 DIAGNOSIS — E781 Pure hyperglyceridemia: Secondary | ICD-10-CM | POA: Diagnosis not present

## 2016-01-24 DIAGNOSIS — R55 Syncope and collapse: Secondary | ICD-10-CM | POA: Insufficient documentation

## 2016-01-24 DIAGNOSIS — R300 Dysuria: Secondary | ICD-10-CM | POA: Diagnosis not present

## 2016-01-24 DIAGNOSIS — R0602 Shortness of breath: Secondary | ICD-10-CM | POA: Insufficient documentation

## 2016-01-24 DIAGNOSIS — Z202 Contact with and (suspected) exposure to infections with a predominantly sexual mode of transmission: Secondary | ICD-10-CM

## 2016-01-24 LAB — POCT URINALYSIS DIP (DEVICE)
BILIRUBIN URINE: NEGATIVE
Glucose, UA: NEGATIVE mg/dL
Ketones, ur: NEGATIVE mg/dL
Leukocytes, UA: NEGATIVE
Nitrite: NEGATIVE
PROTEIN: NEGATIVE mg/dL
Specific Gravity, Urine: 1.025 (ref 1.005–1.030)
Urobilinogen, UA: 0.2 mg/dL (ref 0.0–1.0)
pH: 5.5 (ref 5.0–8.0)

## 2016-01-24 MED ORDER — CEFTRIAXONE SODIUM 250 MG IJ SOLR
INTRAMUSCULAR | Status: AC
  Filled 2016-01-24: qty 250

## 2016-01-24 MED ORDER — CEFTRIAXONE SODIUM 250 MG IJ SOLR
250.0000 mg | Freq: Once | INTRAMUSCULAR | Status: AC
  Administered 2016-01-24: 250 mg via INTRAMUSCULAR

## 2016-01-24 MED ORDER — AZITHROMYCIN 250 MG PO TABS
ORAL_TABLET | ORAL | Status: AC
  Filled 2016-01-24: qty 4

## 2016-01-24 MED ORDER — AZITHROMYCIN 250 MG PO TABS
1000.0000 mg | ORAL_TABLET | Freq: Once | ORAL | Status: AC
  Administered 2016-01-24: 1000 mg via ORAL

## 2016-01-24 NOTE — Discharge Instructions (Signed)
We will call with positive test results and treat as indicated  °

## 2016-01-24 NOTE — ED Triage Notes (Signed)
Pt  Reports  Irritation  To  Penis   3  Days  Ago       After   copndom  Burst    denys  Any  Discharge    No  Burning  On  Urination  Pt  Rep[orts  Sensation of a  tingle

## 2016-01-24 NOTE — ED Provider Notes (Signed)
[facility]    CSN: [id number] Arrival date & time: 01/24/16  1001     History   Chief Complaint Chief Complaint  Patient presents with  . Groin Pain    HPI Dustin Luna is a 45 y.o. male.   The history is provided by the patient.  Groin Pain  This is a new problem. The current episode started more than 2 days ago (condom broke on mon with sl dysuria since tues, no lesions.). The problem has not changed since onset.Pertinent negatives include no chest pain and no abdominal pain.    Past Medical History:  Diagnosis Date  . Hypertension     Patient Active Problem List   Diagnosis Date Noted  . Chest pain at rest 01/08/2014  . Abnormal echocardiogram 01/08/2014  . High triglycerides 11/10/2013  . Shortness of breath 10/05/2013  . GERD (gastroesophageal reflux disease) 10/05/2013  . Situational syncope 10/05/2013  . Atypical chest pain 10/05/2013  . HTN (hypertension) 09/25/2013  . Renal insufficiency 09/25/2013    Past Surgical History:  Procedure Laterality Date  . NO PAST SURGERIES         Home Medications    Prior to Admission medications   Medication Sig Start Date End Date Taking? Authorizing Provider  albuterol-ipratropium (COMBIVENT) 18-103 MCG/ACT inhaler Inhale 2 puffs into the lungs 4 (four) times daily as needed for wheezing. 10/05/13 10/05/14  Donavan FoilEverett J Moding, MD  gemfibrozil (LOPID) 600 MG tablet Take 1 tablet (600 mg total) by mouth 2 (two) times daily before a meal. 11/10/13   Tasrif Ahmed, MD  omeprazole (PRILOSEC) 20 MG capsule Take 1 capsule (20 mg total) by mouth daily. 11/10/13 12/11/13  Hyacinth Meekerasrif Ahmed, MD    Family History Family History  Problem Relation Age of Onset  . Other Father     cardiac stent  . Other Brother     cardiac stent    Social History Social History  Substance Use Topics  . Smoking status: Never Smoker  . Smokeless tobacco: Never Used  . Alcohol use Yes     Comment: Ocaasional.     Allergies     Patient has no known allergies.   Review of Systems Review of Systems  Constitutional: Negative.   Cardiovascular: Negative for chest pain.  Gastrointestinal: Negative.  Negative for abdominal pain.  Genitourinary: Positive for dysuria. Negative for discharge, penile pain, penile swelling, scrotal swelling, testicular pain and urgency.  All other systems reviewed and are negative.    Physical Exam Triage Vital Signs ED Triage Vitals [01/24/16 1009]  Enc Vitals Group     BP 139/88     Pulse Rate 65     Resp 12     Temp 98.3 F (36.8 C)     Temp Source Oral     SpO2 98 %     Weight      Height      Head Circumference      Peak Flow      Pain Score      Pain Loc      Pain Edu?      Excl. in GC?    No data found.   Updated Vital Signs BP 139/88 (BP Location: Right Arm)   Pulse 65   Temp 98.3 F (36.8 C) (Oral)   Resp 12   SpO2 98%   Visual Acuity Right Eye Distance:   Left Eye Distance:   Bilateral Distance:    Right Eye Near:  Left Eye Near:    Bilateral Near:     Physical Exam  Constitutional: He is oriented to person, place, and time. He appears well-developed and well-nourished.  Abdominal: Soft. Bowel sounds are normal.  Genitourinary: Penis normal. No penile tenderness.  Musculoskeletal: Normal range of motion.  Neurological: He is alert and oriented to person, place, and time.  Nursing note and vitals reviewed.    UC Treatments / Results  Labs (all labs ordered are listed, but only abnormal results are displayed) Labs Reviewed  URINE CYTOLOGY ANCILLARY ONLY    EKG  EKG Interpretation None       Radiology No results found.  Procedures Procedures (including critical care time)  Medications Ordered in UC Medications - No data to display   Initial Impression / Assessment and Plan / UC Course  I have reviewed the triage vital signs and the nursing notes.  Pertinent labs & imaging results that were available during my care of  the patient were reviewed by me and considered in my medical decision making (see chart for details).  Clinical Course       Final Clinical Impressions(s) / UC Diagnoses   Final diagnoses:  None    New Prescriptions New Prescriptions   No medications on file     Linna HoffJames D Zerina Hallinan, MD 01/24/16 1036

## 2016-01-27 LAB — URINE CYTOLOGY ANCILLARY ONLY
Chlamydia: NEGATIVE
Neisseria Gonorrhea: NEGATIVE
TRICH (WINDOWPATH): NEGATIVE

## 2016-01-29 ENCOUNTER — Telehealth (HOSPITAL_COMMUNITY): Payer: Self-pay | Admitting: Emergency Medicine

## 2016-01-29 NOTE — Telephone Encounter (Signed)
Pt called needing lab results from visit on 11/17  Notified of recent lab results from visit Pt ID'd properly... Reports feeling fine Adv pt if sx are not getting better to return or to f/u w/PCP Education on safe sex given Pt verb understanding.

## 2016-02-03 LAB — URINE CYTOLOGY ANCILLARY ONLY: Bacterial vaginitis: NEGATIVE

## 2016-06-18 ENCOUNTER — Encounter: Payer: Self-pay | Admitting: Internal Medicine

## 2016-06-18 ENCOUNTER — Ambulatory Visit (INDEPENDENT_AMBULATORY_CARE_PROVIDER_SITE_OTHER): Payer: PRIVATE HEALTH INSURANCE | Admitting: Internal Medicine

## 2016-06-18 VITALS — BP 126/80 | HR 76 | Temp 98.2°F | Ht 69.0 in | Wt 234.0 lb

## 2016-06-18 DIAGNOSIS — Z0000 Encounter for general adult medical examination without abnormal findings: Secondary | ICD-10-CM

## 2016-06-18 DIAGNOSIS — I10 Essential (primary) hypertension: Secondary | ICD-10-CM

## 2016-06-18 DIAGNOSIS — E785 Hyperlipidemia, unspecified: Secondary | ICD-10-CM | POA: Diagnosis not present

## 2016-06-18 DIAGNOSIS — R7309 Other abnormal glucose: Secondary | ICD-10-CM

## 2016-06-18 LAB — POCT GLYCOSYLATED HEMOGLOBIN (HGB A1C): Hemoglobin A1C: 4.7

## 2016-06-18 NOTE — Progress Notes (Signed)
46 y.o. year old male presents to establish care.  Patient wanting to establish care today because he has not had a PCP since 2015 after hospital admission. He also had one acute concern he would like to discuss (see below).   Acute Concerns: Patient reports feeling of mucus in his throat for the past month. Says when symptoms began, he also had watery eyes and sinus pressure with nasal congestion. Had a cough for about a day. Symptoms then resolved, however congestion remained, but now feels more in his throat than nasal. He has not tried any OTC meds for symptoms. Mucus was initially thick and yellow but is now clear. Denies any bloody. Denies fevers, sore throat, appetite changes. Has been generally feeling well. No history of seasonal allergies.   PMH Denies any preexisting medical conditions. Was unaware of past diagnosis of HTN. Says he never picked up medication that was prescribed for this (gemfibrozil). Does not check blood pressure. Has not seen cardiology since 2015. Says was told he did not need to follow-up.  No prior surgeries.   Diet: Says he does not eat healthy at all. Eats fast food 2-3 times daily. Eats fried food often. Drinks water and fruit juices, such as Naked juice.   Exercise: No exercise. Works for a [employer] however works at a [employer], sport and exercise.   Sexual History: Sexually active with spouse. 1 partner in past year.   Past Medical History:  Diagnosis Date  . Hypertension    Past Surgical History:  Procedure Laterality Date  . NO PAST SURGERIES     No Known Allergies Family History  Problem Relation Age of Onset  . Other Father     cardiac stent  . Leukemia Father   . Other Brother     cardiac stent  . Emphysema Mother   . Cancer Mother     Unspecified   Social:  Social History   Social History  . Marital status: Married    Spouse name: N/A  . Number of children: N/A  . Years of education: N/A   Social History Main Topics   . Smoking status: Never Smoker  . Smokeless tobacco: Never Used  . Alcohol use Yes     Comment: Once a week  . Drug use: No  . Sexual activity: Yes    Partners: Female    Birth control/ protection: Condom   Other Topics Concern  . None   Social History Narrative  . None   Immunization:  There is no immunization history on file for this patient. Last Tdap 1991. Decline booster today.   Cancer Screening:  Colonoscopy: N/A  Prostate Exam: N/A  Physical Exam: VITALS: Reviewed GEN: Pleasant male, NAD HEENT: Normocephalic, PERRL, EOMI, no scleral icterus, bilateral TM pearly grey, nasal septum midline, MMM, uvula midline, no anterior or posterior lymphadenopathy, no thyromegaly CARDIAC:RRR, S1 and S2 present, no murmur, no heaves/thrills RESP: CTAB, normal effort ABD: soft, no tenderness, normal bowel sounds EXT: No edema, 2+ radial and DP pulses SKIN: no rash  ASSESSMENT & PLAN: 46 y.o. male presents for annual well male/preventative exam. Please see problem specific assessment and plan.   Healthcare maintenance Given history of HTN and hyperlipidemia as well as obesity, will check baseline labs today. Offered vaccines to patient, however he declined.  - BMP, lipid panel, A1C today - Will call patient if any abnormalities - F/u in one year otherwise  Tarri Abernethy, MD, MPH PGY-2 [facility]  Pager [phone]

## 2016-06-18 NOTE — Patient Instructions (Addendum)
It was nice meeting you today Mr. Dennin!  To help to up the mucus in your throat, you can try over the counter medicines that contain guaifenesin, such as Mucinex and Tussin. Take as directed on the packaging.   I will call you if there are any abnormalities with your labwork, otherwise we will see you back in one year for your next physical.   If you have any questions or concerns, please feel free to call the clinic.   Be well,  Dr. Natale Milch

## 2016-06-18 NOTE — Assessment & Plan Note (Signed)
Given history of HTN and hyperlipidemia as well as obesity, will check baseline labs today. Offered vaccines to patient, however he declined.  - BMP, lipid panel, A1C today - Will call patient if any abnormalities - F/u in one year otherwise

## 2016-06-19 LAB — BASIC METABOLIC PANEL
BUN/Creatinine Ratio: 14 (ref 9–20)
BUN: 15 mg/dL (ref 6–24)
CO2: 22 mmol/L (ref 18–29)
Calcium: 9.2 mg/dL (ref 8.7–10.2)
Chloride: 101 mmol/L (ref 96–106)
Creatinine, Ser: 1.09 mg/dL (ref 0.76–1.27)
GFR calc Af Amer: 94 mL/min/{1.73_m2} (ref >59)
GFR calc non Af Amer: 82 mL/min/{1.73_m2} (ref >59)
GLUCOSE: 86 mg/dL (ref 65–99)
Potassium: 4.2 mmol/L (ref 3.5–5.2)
Sodium: 141 mmol/L (ref 134–144)

## 2016-06-19 LAB — LIPID PANEL
CHOL/HDL RATIO: 4.2 ratio (ref 0.0–5.0)
Cholesterol, Total: 161 mg/dL (ref 100–199)
HDL: 38 mg/dL — AB (ref >39)
LDL Calculated: 88 mg/dL (ref 0–99)
Triglycerides: 176 mg/dL — ABNORMAL HIGH (ref 0–149)
VLDL Cholesterol Cal: 35 mg/dL (ref 5–40)

## 2017-04-21 ENCOUNTER — Encounter: Payer: Self-pay | Admitting: Internal Medicine

## 2017-04-21 ENCOUNTER — Ambulatory Visit (INDEPENDENT_AMBULATORY_CARE_PROVIDER_SITE_OTHER): Payer: Self-pay | Admitting: Internal Medicine

## 2017-04-21 DIAGNOSIS — K219 Gastro-esophageal reflux disease without esophagitis: Secondary | ICD-10-CM

## 2017-04-21 MED ORDER — OMEPRAZOLE 20 MG PO CPDR: 20.0000 mg | DELAYED_RELEASE_CAPSULE | Freq: Every day | ORAL | 3 refills | Status: DC

## 2017-04-21 NOTE — Progress Notes (Signed)
   Subjective:   Patient: Dustin Luna       Birthdate: 03/09/1971       MRN: [id number]      HPI  Dustin GritKevin E Dimitri is a 47 y.o. male presenting for cold symptoms and abd burning sensation.   Cold Symptoms Began 2.5 days ago. Beginning to improve. Started with sore throat and myalgias. Has since developed cough as well. No nasal congestion. No known sick contacts. Has been taking Alka Seltzer which has helped some. Decreased appetite but is drinking well. Subjective fever. No vomiting, diarrhea. Has not taken amy meds other than Alka Seltzer.   Abd burning sensation Chronic issue. Describes burning sensation at midline in upper quadrants. Cannot say if related to food intake or not. Has never taken medication for this. Was told in the past by another provider that he had GERD, however he wanted to check today to make sure he didn't need a workup to rule out a more serious etiology. No accompanying symptoms, just burning sensation. Denies weight loss, early satiety, cough, vomiting.   Smoking status reviewed. Patient is never smoker.   Review of Systems See HPI.     Objective:  Physical Exam  Constitutional: He is oriented to person, place, and time and well-developed, well-nourished, and in no distress.  HENT:  Head: Normocephalic and atraumatic.  Mouth/Throat: Oropharynx is clear and moist. No oropharyngeal exudate.  Cardiovascular: Normal rate, regular rhythm and normal heart sounds.  No murmur heard. Pulmonary/Chest: Effort normal and breath sounds normal. No respiratory distress. He has no wheezes.  Abdominal: Soft. Bowel sounds are normal. He exhibits no distension. There is no tenderness.  Neurological: He is alert and oriented to person, place, and time.  Skin: Skin is warm and dry.  Psychiatric: Affect and judgment normal.      Assessment & Plan:  GERD (gastroesophageal reflux disease) Chronic issue. Reportedly has never taken medication for this, so not surprising that  symptoms have not improved. Patient now amenable to trying medication. Will begin trial of omeprazole. Also encouraged patient to begin food diary to identify possible triggers. Patient amenable to this.  - Begin omeprazole 20mg  qd  URI Most likely viral etiology. Improving already with only Alka Seltzer. Afebrile and well-appearing on exam today. Lungs CTAB with normal WOB on RA. Flu included on differential, however patient appears very well today, and since patient is already improving, diagnosis of flu would not change treatment plan. Discussed supportive therapy with continued fluid intake and ibuprofen/Tylenol for body aches/fevers. Return precautions discussed.   Tarri AbernethyAbigail J Lyn Joens, MD, MPH PGY-3 [facility] Pager [phone]

## 2017-04-21 NOTE — Patient Instructions (Signed)
It was nice seeing you again today Dustin Luna!  For your cold, you can take ibuprofen or Tylenol as needed for body aches, headaches, or fevers. For your cough and sore throat, you can eat a teaspoonful of honey, either alone or mixed into a warm beverage, 3-4 times a day, or use Cepacol throat lozenges (can be purchased at grocery or drugstore).   Please begin taking omeprazole once daily to help with your heartburn. You can also begin keeping a food diary to try to identify foods which may be causing your symptoms. To do this, write down the date and time that you had pain, as well as the food or beverages that you had prior to the pain beginning. If your symptoms do not get better after about a month of taking the medication, please let me know.   If you have any questions or concerns, please feel free to call the clinic.   Be well,  Dr. Natale MilchL[place]

## 2017-04-21 NOTE — Assessment & Plan Note (Signed)
Chronic issue. Reportedly has never taken medication for this, so not surprising that symptoms have not improved. Patient now amenable to trying medication. Will begin trial of omeprazole. Also encouraged patient to begin food diary to identify possible triggers. Patient amenable to this.  - Begin omeprazole 20mg  qd

## 2017-07-26 NOTE — Progress Notes (Signed)
   Subjective   Patient ID: Dustin Luna    DOB: August 09, 1970, 47 y.o. male   MRN: [id number]  CC: "Cough"  HPI: Dustin Luna is a 47 y.o. male who presents to clinic today for the following:  Cough: Patient states he has been having 8 months of intermittent coughing.  This does not seem to be affecting him at certain times of the day.  He does endorse some white phlegm production occasionally.  He was previously tried on a PPI for reflux but has not taken medication in a long time.  Patient denies shortness of breath, chest pain, sinus congestion, ear pain, sore throat, hemoptysis, unexplained weight loss.  He has not tried any medications to alleviate his symptoms.  He is a never smoker and denies illicit drug use.  He is a social drinker.  Pre-hypertension: Patient has diagnosis of hypertension and was told in the past by other providers that he needed to get his blood pressure control.  Patient denies history of smoking.  He was not on any prior antihypertensive.  ROS: see HPI for pertinent.  PMFSH: HTN, HLD, GERD.  Surgical history unremarkable.  Family history emphysema, cancer (mother, unspecified), leukemia (father).  Smoking status reviewed. Medications reviewed.  Objective   BP 134/80   Pulse 75   Ht  (1.753 m)   Wt 229 lb (103.9 kg)   SpO2 96%   BMI 33.82 kg/m  Vitals and nursing note reviewed.  General: well nourished, well developed, NAD with non-toxic appearance HEENT: normocephalic, atraumatic, moist mucous membranes, slightly enlarged tonsils without erythema Neck: supple, non-tender without lymphadenopathy Cardiovascular: regular rate and rhythm without murmurs, rubs, or gallops Lungs: clear to auscultation bilaterally with normal work of breathing Skin: warm, dry, no rashes or lesions, cap refill < 2 seconds Extremities: warm and well perfused, normal tone, no edema  Assessment & Plan   Chronic cough Chronic.  Unspecified etiology.  Suspicious for GERD.   Has prior diagnosis of GERD with use of Prilosec.  Differential also includes chronic allergies.  No focal findings on lung exam. - Initiating Zantac 150 mg twice daily with instructions to avoid triggers - RTC 1 month  Prehypertension Chronic.  Has diagnosis with known hyperlipidemia.  Never smoker. - Continue monitoring BPs at visits - Advised patient to incorporate exercise - RTC 1 month at which point he will evaluate for statin therapy  No orders of the defined types were placed in this encounter.  Meds ordered this encounter  Medications  . ranitidine (ZANTAC) 150 MG tablet    Sig: Take 1 tablet (150 mg total) by mouth 2 (two) times daily.    Dispense:  180 tablet    Refill:  0    Durward Parcel, DO [facility], PGY-2 07/27/2017, 2:31 PM

## 2017-07-27 ENCOUNTER — Ambulatory Visit (INDEPENDENT_AMBULATORY_CARE_PROVIDER_SITE_OTHER): Payer: 59 | Admitting: Family Medicine

## 2017-07-27 ENCOUNTER — Other Ambulatory Visit: Payer: Self-pay

## 2017-07-27 ENCOUNTER — Encounter: Payer: Self-pay | Admitting: Family Medicine

## 2017-07-27 VITALS — BP 134/80 | HR 75 | Ht 69.0 in | Wt 229.0 lb

## 2017-07-27 DIAGNOSIS — R030 Elevated blood-pressure reading, without diagnosis of hypertension: Secondary | ICD-10-CM | POA: Diagnosis not present

## 2017-07-27 DIAGNOSIS — K219 Gastro-esophageal reflux disease without esophagitis: Secondary | ICD-10-CM | POA: Diagnosis not present

## 2017-07-27 DIAGNOSIS — R053 Chronic cough: Secondary | ICD-10-CM | POA: Insufficient documentation

## 2017-07-27 DIAGNOSIS — R05 Cough: Secondary | ICD-10-CM

## 2017-07-27 MED ORDER — RANITIDINE HCL 150 MG PO TABS: 150.0000 mg | ORAL_TABLET | Freq: Two times a day (BID) | ORAL | 0 refills | Status: DC

## 2017-07-27 NOTE — Assessment & Plan Note (Signed)
Chronic.  Has diagnosis with known hyperlipidemia.  Never smoker. - Continue monitoring BPs at visits - Advised patient to incorporate exercise - RTC 1 month at which point he will evaluate for statin therapy

## 2017-07-27 NOTE — Patient Instructions (Signed)
Thank you for coming in to see [place] today. Please see below to review our plan for today's visit.  1.  I would like to do a trial with a medication called Zantac for your cough.  Take 150 mg tablet twice daily.  I would also like to to avoid certain foods as listed below.  Your symptoms do sound consistent with reflux.  We will follow-up in 1 month to see how you are doing.  It is important that you take this medication as prescribed and avoid the food and beverages as listed below. 2.  Your blood pressure is in the prehypertensive range.  Incorporate some form of exercise such as running and weightlifting on a regular basis throughout the week.  Trying to achieve 150 minutes of moderate to high intensity exercise will be good for your wellness and improve your health and quality of life.  Please call the clinic at [phone] if your symptoms worsen or you have any concerns. It was our pleasure to serve you.  Durward Parcel, DO [facility], PGY-2   Food Choices for Gastroesophageal Reflux Disease, Adult When you have gastroesophageal reflux disease (GERD), the foods you eat and your eating habits are very important. Choosing the right foods can help ease your discomfort. What guidelines do I need to follow?  Choose fruits, vegetables, whole grains, and low-fat dairy products.  Choose low-fat meat, fish, and poultry.  Limit fats such as oils, salad dressings, butter, nuts, and avocado.  Keep a food diary. This helps you identify foods that cause symptoms.  Avoid foods that cause symptoms. These may be different for everyone.  Eat small meals often instead of 3 large meals a day.  Eat your meals slowly, in a place where you are relaxed.  Limit fried foods.  Cook foods using methods other than frying.  Avoid drinking alcohol.  Avoid drinking large amounts of liquids with your meals.  Avoid bending over or lying down until 2-3 hours after eating. What foods are not  recommended? These are some foods and drinks that may make your symptoms worse: Vegetables Tomatoes. Tomato juice. Tomato and spaghetti sauce. Chili peppers. Onion and garlic. Horseradish. Fruits Oranges, grapefruit, and lemon (fruit and juice). Meats High-fat meats, fish, and poultry. This includes hot dogs, ribs, ham, sausage, salami, and bacon. Dairy Whole milk and chocolate milk. Sour cream. Cream. Butter. Ice cream. Cream cheese. Drinks Coffee and tea. Bubbly (carbonated) drinks or energy drinks. Condiments Hot sauce. Barbecue sauce. Sweets/Desserts Chocolate and cocoa. Donuts. Peppermint and spearmint. Fats and Oils High-fat foods. This includes [place] fries and potato chips. Other Vinegar. Strong spices. This includes black pepper, white pepper, red pepper, cayenne, curry powder, cloves, ginger, and chili powder. The items listed above may not be a complete list of foods and drinks to avoid. Contact your dietitian for more information. This information is not intended to replace advice given to you by your health care provider. Make sure you discuss any questions you have with your health care provider. Document Released: 08/25/2011 Document Revised: 08/01/2015 Document Reviewed: 12/28/2012 Elsevier Interactive Patient Education  2017 [employer].

## 2017-07-27 NOTE — Assessment & Plan Note (Signed)
Chronic.  Unspecified etiology.  Suspicious for GERD.  Has prior diagnosis of GERD with use of Prilosec.  Differential also includes chronic allergies.  No focal findings on lung exam. - Initiating Zantac 150 mg twice daily with instructions to avoid triggers - RTC 1 month

## 2018-09-05 ENCOUNTER — Other Ambulatory Visit: Payer: Self-pay

## 2018-09-05 ENCOUNTER — Telehealth: Payer: Self-pay | Admitting: *Deleted

## 2018-09-05 ENCOUNTER — Telehealth (INDEPENDENT_AMBULATORY_CARE_PROVIDER_SITE_OTHER): Payer: 59 | Admitting: Family Medicine

## 2018-09-05 DIAGNOSIS — Z20822 Contact with and (suspected) exposure to covid-19: Secondary | ICD-10-CM

## 2018-09-05 DIAGNOSIS — Z20828 Contact with and (suspected) exposure to other viral communicable diseases: Secondary | ICD-10-CM | POA: Diagnosis not present

## 2018-09-05 NOTE — Progress Notes (Signed)
Dustin Luna Telemedicine Visit  Patient consented to have virtual visit. Method of visit: Telephone  Encounter participants: Patient: Dustin Luna - located at home Provider: Cesar Chavez Luna - located at office  Chief Complaint: "COVID exposure"  HPI:  Patient's child Dustin Luna recently visited back on Father's Day 8 days ago.  Her mother (not Dustin Luna) tested positive for COVID-19, however Dustin Luna remained asymptomatic up until 3 days ago when she developed URI symptoms including rhinorrhea and cough.  Patient was around Dustin Luna along with other family members, however nobody has developed symptoms.  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  Patient denies cough, shortness of breath, chest pain, rhinorrhea, sore throat, loss of sense of smell, nausea or vomiting, abdominal pain, diarrhea.  ROS: per HPI  Pertinent PMHx: Reviewed  Exam:  Respiratory: speaking in clear sentences, non-labored breathing  Assessment/Plan:  Close Exposure to Covid-19 Virus Recent exposure to sick contact with symptoms of COVID-19 who was exposed to a positive case.  Remains asymptomatic.  Low risk, however patient lives with wife who is high risk due to her occupation. - We will screen for COVID-19 advised to quarantine until results are obtained - Reviewed return precautions, RTC as needed    Time spent during visit with patient: 15 minutes

## 2018-09-05 NOTE — Assessment & Plan Note (Signed)
Recent exposure to sick contact with symptoms of COVID-19 who was exposed to a positive case.  Remains asymptomatic.  Low risk, however patient lives with wife who is high risk due to her occupation. - We will screen for COVID-19 advised to quarantine until results are obtained - Reviewed return precautions, RTC as needed

## 2018-09-05 NOTE — Telephone Encounter (Signed)
Pt scheduled for covid testing 09/06/18 @ 1:30pm. Instructions given to mother and order placed

## 2018-09-05 NOTE — Telephone Encounter (Signed)
-----   Message from [place] Bing, DO sent at 09/05/2018  2:52 PM EDT ----- Regarding: COVID screen COVID Drive-Up Test Referral Criteria  Patient age: 48 y.o.  Symptoms: No Symptoms  Underlying Conditions: No underlying conditions  Is the patient a first responder? No  Does the patient live or work in a high risk or high density environment: No  Is the patient a COVID convalescent patient who is 14-28 days symptom-free and interested in donating plasma for use as a therapeutic product? No  Wife works at long-term health-care facility. Patient was close contact to symptomatic patient who was exposed to COVID-19.

## 2018-09-06 ENCOUNTER — Other Ambulatory Visit: Payer: Self-pay

## 2018-09-06 DIAGNOSIS — Z20822 Contact with and (suspected) exposure to covid-19: Secondary | ICD-10-CM

## 2018-09-12 LAB — NOVEL CORONAVIRUS, NAA: SARS-CoV-2, NAA: NOT DETECTED

## 2019-02-09 ENCOUNTER — Other Ambulatory Visit: Payer: Self-pay

## 2019-02-09 DIAGNOSIS — Z20822 Contact with and (suspected) exposure to covid-19: Secondary | ICD-10-CM

## 2019-02-11 LAB — NOVEL CORONAVIRUS, NAA: SARS-CoV-2, NAA: NOT DETECTED

## 2019-02-13 ENCOUNTER — Telehealth: Payer: Self-pay | Admitting: *Deleted

## 2019-02-13 NOTE — Telephone Encounter (Signed)
Pt informed. Deseree Blount, CMA  

## 2019-02-13 NOTE — Telephone Encounter (Signed)
-----   Message from Dustin Luna, [place] sent at 02/11/2019  5:14 PM EST ----- Please inform patient of negative test result. Thank you.

## 2019-03-09 ENCOUNTER — Ambulatory Visit: Payer: 59 | Attending: Internal Medicine

## 2019-03-09 DIAGNOSIS — Z20822 Contact with and (suspected) exposure to covid-19: Secondary | ICD-10-CM

## 2019-03-11 LAB — NOVEL CORONAVIRUS, NAA: SARS-CoV-2, NAA: NOT DETECTED

## 2019-05-10 ENCOUNTER — Telehealth: Payer: Self-pay

## 2019-05-10 NOTE — Telephone Encounter (Signed)
Patient calls nurse line stating he been experiencing chest pains for the last few days. Patient stated last night he felt his left side, especially his left arm, tingle. Patient also stated he has been SOB for a few days. I advised patient with his sxs she should be advised at the ED as soon as possible. Patient agreed with plan. Patient also aware it has been 2 years since he has been seen in office and to schedule an apt soon.

## 2019-05-11 ENCOUNTER — Ambulatory Visit (INDEPENDENT_AMBULATORY_CARE_PROVIDER_SITE_OTHER): Payer: 59

## 2019-05-11 ENCOUNTER — Ambulatory Visit (HOSPITAL_COMMUNITY)
Admission: EM | Admit: 2019-05-11 | Discharge: 2019-05-11 | Disposition: A | Discharge status: Home / Self Care | Payer: Self-pay | Attending: Family Medicine | Admitting: Family Medicine

## 2019-05-11 ENCOUNTER — Encounter (HOSPITAL_COMMUNITY): Payer: Self-pay | Admitting: Emergency Medicine

## 2019-05-11 ENCOUNTER — Other Ambulatory Visit: Payer: Self-pay

## 2019-05-11 DIAGNOSIS — R079 Chest pain, unspecified: Secondary | ICD-10-CM

## 2019-05-11 DIAGNOSIS — R030 Elevated blood-pressure reading, without diagnosis of hypertension: Secondary | ICD-10-CM

## 2019-05-11 NOTE — ED Triage Notes (Signed)
Pt here for left sided CP episode 3 days ago that is now resolved; pt sts hx of same x 3 years

## 2019-05-11 NOTE — ED Provider Notes (Signed)
[facility] CARE CENTER   [id number] 05/11/19 Arrival Time: 1821  ASSESSMENT & PLAN:  1. Chest pain, unspecified type   2. Elevated blood pressure reading without diagnosis of hypertension     Without current symptoms.  ECG: Performed today and interpreted by me: normal EKG, normal sinus rhythm. No STEMI.  I have personally viewed the imaging studies ordered this visit. CXR: Mild cardiomegaly. Otherwise unremarkable.  With elevated BP, years of SOB with exertion, and sporadic CP I recommend that he schedule appt with cardiology. May also address elevated BP if this continues. No s/s of hypertensive urgency. ED if abrupt worsening of reported symptoms. He agrees. Chest pain precautions given.   Follow-up Information    Schedule an appointment as soon as possible for a visit  with [facility] [address] Office.   Specialty: Cardiology Contact information: [address] [place] [address] [phone]          Reviewed expectations re: course of current medical issues. Questions answered. Outlined signs and symptoms indicating need for more acute intervention. Patient verbalized understanding. After Visit Summary given.   SUBJECTIVE:  History from: patient. Dustin Luna is a 49 y.o. male who presents with complaint of intermittent episodes of non-radiating anterior "pain in my chest" without associated n/v/diaphoresis. History of the same he thinks approx 3 years ago. chart review shows echo in 2015 with negative stress test. Reports chest pain randomly occurs; may last a few minutes up to several hours. Does reports feeling SOB when pain is present. "But I basically get short of breath anytime I so something strenuous". This has been going on for years. No LE edema or palpitations. Has been told BP elevated in past but normal on several follow up visits. No previous treatment for elevated BP which is noted high this evening. No GERD symptoms  associated with chest pain. Normal appetite and PO intake. Last episode of chest pain approx 3 days ago. Decided to come here tonight "just to be checked". No current chest pain. Reports no street drug use.  Social History   Tobacco Use  Smoking Status Never Smoker  Smokeless Tobacco Never Used   Social History   Substance and Sexual Activity  Alcohol Use Yes   Comment: Once a week    OBJECTIVE:  Vitals:   05/11/19 1843  BP: (!) 174/101  Pulse: 75  Resp: 18  Temp: 98.5 F (36.9 C)  TempSrc: Oral  SpO2: 95%    Elevated BP noted.  General appearance: alert, oriented, no acute distress Eyes: PERRLA; EOMI; conjunctivae normal HENT: normocephalic; atraumatic Neck: supple with FROM Lungs: without labored respirations; speaks full sentences without difficulty; CTAB Heart: regular rate and rhythm without murmer Chest Wall: without tenderness to palpation Abdomen: soft, non-tender; no guarding or rebound tenderness Extremities: without edema; without calf swelling or tenderness; symmetrical without gross deformities Skin: warm and dry; without rash or lesions Neuro: normal gait Psychological: alert and cooperative; normal mood and affect  Imaging: No results found.   No Known Allergies  Past Medical History:  Diagnosis Date  . Hypertension    Social History   Socioeconomic History  . Marital status: Married    Spouse name: Not on file  . Number of children: Not on file  . Years of education: Not on file  . Highest education level: Not on file  Occupational History  . Not on file  Tobacco Use  . Smoking status: Never Smoker  . Smokeless tobacco:  Never Used  Substance and Sexual Activity  . Alcohol use: Yes    Comment: Once a week  . Drug use: No  . Sexual activity: Yes    Partners: Female    Birth control/protection: Condom  Other Topics Concern  . Not on file  Social History Narrative  . Not on file   Social Determinants of Health   Financial  Resource Strain:   . Difficulty of Paying Living Expenses: Not on file  Food Insecurity:   . Worried About Charity fundraiser in the Last Year: Not on file  . Ran Out of Food in the Last Year: Not on file  Transportation Needs:   . Lack of Transportation (Medical): Not on file  . Lack of Transportation (Non-Medical): Not on file  Physical Activity:   . Days of Exercise per Week: Not on file  . Minutes of Exercise per Session: Not on file  Stress:   . Feeling of Stress : Not on file  Social Connections:   . Frequency of Communication with Friends and Family: Not on file  . Frequency of Social Gatherings with Friends and Family: Not on file  . Attends Religious Services: Not on file  . Active Member of Clubs or Organizations: Not on file  . Attends Archivist Meetings: Not on file  . Marital Status: Not on file  Intimate Partner Violence:   . Fear of Current or Ex-Partner: Not on file  . Emotionally Abused: Not on file  . Physically Abused: Not on file  . Sexually Abused: Not on file   Family History  Problem Relation Age of Onset  . Other Father        cardiac stent  . Leukemia Father   . Other Brother        cardiac stent  . Emphysema Mother   . Cancer Mother        Unspecified   Past Surgical History:  Procedure Laterality Date  . NO PAST SURGERIES       Vanessa Kick, MD 05/11/19 1949

## 2019-05-11 NOTE — Discharge Instructions (Signed)
You have been seen at the [facility] today for chest pain. Your evaluation today was not suggestive of any emergent condition requiring medical intervention at this time. Your ECG (heart tracing) did not show any worrisome changes. However, some medical problems make take more time to appear. Therefore, it's very important that you pay attention to any new symptoms or worsening of your current condition.  Please proceed directly to the Emergency Department immediately should you feel worse in any way or have any of the following symptoms: increasing or different chest pain, pain that spreads to your arm, neck, jaw, back or abdomen, or nausea and vomiting.

## 2019-05-18 NOTE — Progress Notes (Signed)
Cardiology Office Note:   Date:  05/19/2019  NAME:  Dustin Luna    MRN: [id number] DOB:  1970-07-02   PCP:  Joana Reamer, DO  Cardiologist:  No primary care provider on file.   Referring MD: Joana Reamer, DO   Chief Complaint  Patient presents with  . Chest Pain   History of Present Illness:   Dustin Luna is a 49 y.o. male with a hx of HTN who is being seen today for the evaluation of chest pain at the request of Joana Reamer, DO. Evaluated in urgent care and EKG showed NSR without acute changes.  He was discharged home.  He reports for the past 1 year has had intermittent episodes of chest pain.  He reports that his left-sided pressure.  Last 15 to 30 minutes and resolves without intervention.  He reports it occurs while sitting at rest and can occur at any time.  No identifiable trigger.  No alleviating factors reported.  He also does report that he gets quite short of breath when he exerts himself.  He reports activity such as climbing a flight of stairs to get him extremely winded.  He has not seen a physician in a number of years and has had not had routine blood work.  He apparently was evaluated in 2015 for chest pain and had a nuclear stress test that was normal.  He did have an echocardiogram which showed low normal cardiac function.  His chest x-ray obtained by his urgent care appointment showed cardiomegaly.  He reports no prior history of heart failure but does report that his father was seen by cardiologist for years.  He is unclear of the details.  He reports he is a never smoker.  Denies any illicit drug use.  He occasionally consumes alcohol.  He reports he is an active.  No routine exercise regimen.  His current job is a Office manager.  He reports no clear association of food with the chest pain.  But does report he can have this at times after food.  Past Medical History: Past Medical History:  Diagnosis Date  . Hypertension     Past Surgical History: Past  Surgical History:  Procedure Laterality Date  . NO PAST SURGERIES      Current Medications: No outpatient medications have been marked as taking for the 05/19/19 encounter (Office Visit) with Sande Rives, MD.     Allergies:    Patient has no known allergies.   Social History: Social History   Socioeconomic History  . Marital status: Married    Spouse name: Not on file  . Number of children: 5  . Years of education: Not on file  . Highest education level: Not on file  Occupational History  . Not on file  Tobacco Use  . Smoking status: Never Smoker  . Smokeless tobacco: Never Used  Substance and Sexual Activity  . Alcohol use: Yes    Comment: Once a week  . Drug use: No  . Sexual activity: Yes    Partners: Female    Birth control/protection: Condom  Other Topics Concern  . Not on file  Social History Narrative  . Not on file   Social Determinants of Health   Financial Resource Strain:   . Difficulty of Paying Living Expenses:   Food Insecurity:   . Worried About Programme researcher, broadcasting/film/video in the Last Year:   . [employer] of Food in the Last Year:  Transportation Needs:   . Film/video editor (Medical):   Marland Kitchen Lack of Transportation (Non-Medical):   Physical Activity:   . Days of Exercise per Week:   . Minutes of Exercise per Session:   Stress:   . Feeling of Stress :   Social Connections:   . Frequency of Communication with Friends and Family:   . Frequency of Social Gatherings with Friends and Family:   . Attends Religious Services:   . Active Member of Clubs or Organizations:   . Attends Archivist Meetings:   Marland Kitchen Marital Status:      Family History: The patient's family history includes Cancer in his mother; Emphysema in his mother; Heart disease in his father; Leukemia in his father; Other in his brother and father.  ROS:   All other ROS reviewed and negative. Pertinent positives noted in the HPI.     EKGs/Labs/Other Studies Reviewed:     The following studies were personally reviewed by me today:  EKG:  EKG is ordered today.  The ekg ordered today demonstrates normal sinus rhythm, heart rate 68, no acute ischemic changes, no evidence of prior infarction, and was personally reviewed by me.   Recent Labs: No results found for requested labs within last 8760 hours.   Recent Lipid Panel    Component Value Date/Time   CHOL 161 06/18/2016 1413   TRIG 176 (H) 06/18/2016 1413   HDL 38 (L) 06/18/2016 1413   CHOLHDL 4.2 06/18/2016 1413   CHOLHDL 5 01/08/2014 1040   VLDL 49.8 (H) 01/08/2014 1040   LDLCALC 88 06/18/2016 1413   LDLDIRECT 73.7 01/08/2014 1040    Physical Exam:   VS:  BP (!) 148/98   Pulse 68   Ht 5\' 9"  (1.753 m)   Wt 240 lb 6.4 oz (109 kg)   SpO2 96%   BMI 35.50 kg/m    Wt Readings from Last 3 Encounters:  05/19/19 240 lb 6.4 oz (109 kg)  07/27/17 229 lb (103.9 kg)  04/21/17 232 lb 9.6 oz (105.5 kg)    General: Well nourished, well developed, in no acute distress Heart: Atraumatic, normal size  Eyes: PEERLA, EOMI  Neck: Supple, no JVD Endocrine: No thryomegaly Cardiac: Normal S1, S2; RRR; no murmurs, rubs, or gallops Lungs: Clear to auscultation bilaterally, no wheezing, rhonchi or rales  Abd: Soft, nontender, no hepatomegaly  Ext: No edema, pulses 2+ Musculoskeletal: No deformities, BUE and BLE strength normal and equal Skin: Warm and dry, no rashes   Neuro: Alert and oriented to person, place, time, and situation, CNII-XII grossly intact, no focal deficits  Psych: Normal mood and affect   ASSESSMENT:   Dustin Luna is a 49 y.o. male who presents for the following: 1. Chest pain, unspecified type   2. SOB (shortness of breath)   3. Essential hypertension     PLAN:   1. Chest pain, unspecified type -Atypical chest pain.  EKG without acute ischemic changes and no evidence of prior infarction.  Does not have typical features of angina.  I suspect this is likely GERD.  Given his CVD risk  factors of obesity and likely undiagnosed hypertension we will proceed with cardiac CTA.  He will take 100 mg of metoprolol tartrate to or for the skin.  A BMP will be obtained 1 week for the scan.  If the scan is negative we will likely start him on acid reflux medication.  2. SOB (shortness of breath) -Shortness of breath that is worse  over the past several months.  No evidence of congestive heart failure examination.  EKG today without acute ischemic changes or evidence of prior infarction.  We will check a full panel of labs including BNP, BMP, CBC, TSH, A1c.  He did have an echocardiogram in 2015 that had an ejection fraction of 45-50%.  He has no symptoms of heart failure but I think we need to repeat an ultrasound of his heart.  3. Essential hypertension -Elevated today.  I suspect his symptoms of chest pain and shortness of breath could be related to undiagnosed hypertension.  In the emergency room his blood pressure was elevated well with systolic values in the 180.  I will start him on Norvasc 10 mg daily.  I advised him extensively on the importance of salt reduction strategies as well as diet and exercise.  He apparently was drinking ensures as he thought they were healthy for him.  I suspect this is likely the cause of recent weight gain.  Disposition: Return in about 3 months (around 08/19/2019).  Medication Adjustments/Labs and Tests Ordered: Current medicines are reviewed at length with the patient today.  Concerns regarding medicines are outlined above.  Orders Placed This Encounter  Procedures  . CT CORONARY MORPH W/CTA COR W/SCORE W/CA W/CM &/OR WO/CM  . CT CORONARY FRACTIONAL FLOW RESERVE DATA PREP  . CT CORONARY FRACTIONAL FLOW RESERVE FLUID ANALYSIS  . Basic metabolic panel  . Brain natriuretic peptide  . Lipid panel  . TSH  . CBC  . Hemoglobin A1c  . EKG 12-Lead  . ECHOCARDIOGRAM COMPLETE   Meds ordered this encounter  Medications  . amLODipine (NORVASC) 10 MG tablet     Sig: Take 1 tablet (10 mg total) by mouth daily.    Dispense:  180 tablet    Refill:  3  . metoprolol tartrate (LOPRESSOR) 100 MG tablet    Sig: Take 1 tablet by mouth once for procedure.    Dispense:  1 tablet    Refill:  0    Patient Instructions  Medication Instructions:  Start Norvasc 10 mg daily   *If you need a refill on your cardiac medications before your next appointment, please call your pharmacy*   Lab Work: BMET, BNP, CBC, TSH, A1C, LIPID today   BMET one week before CT If you have labs (blood work) drawn today and your tests are completely normal, you will receive your results only by: Marland Kitchen MyChart Message (if you have MyChart) OR . A paper copy in the mail If you have any lab test that is abnormal or we need to change your treatment, we will call you to review the results.   Testing/Procedures: Echocardiogram - Your physician has requested that you have an echocardiogram. Echocardiography is a painless test that uses sound waves to create images of your heart. It provides your doctor with information about the size and shape of your heart and how well your heart's chambers and valves are working. This procedure takes approximately one hour. There are no restrictions for this procedure. This will be performed at our [facility] location - [address].  Cardiac CTA   Follow-Up: At [facility], you and your health needs are our priority.  As part of our continuing mission to provide you with exceptional heart care, we have created designated Provider Care Teams.  These Care Teams include your primary Cardiologist (physician) and Advanced Practice Providers (APPs -  Physician Assistants and Nurse Practitioners) who all  work together to provide you with the care you need, when you need it.  We recommend signing up for the patient portal called "MyChart".  Sign up information is provided on this After Visit Summary.  MyChart is used to connect with  patients for Virtual Visits (Telemedicine).  Patients are able to view lab/test results, encounter notes, upcoming appointments, etc.  Non-urgent messages can be sent to your provider as well.   To learn more about what you can do with MyChart, go to [email or web address].    Your next appointment:   3 month(s)  The format for your next appointment:   In Person  Provider:   Lennie Odor, MD   Other Instructions Your cardiac CT will be scheduled at one of the below locations:   [facility] [address] [place], [place] [address] [phone]  If scheduled at [facility], please arrive at the [facility] main entrance of [facility] 30 minutes prior to test start time. Proceed to the [facility] Radiology Department (first floor) to check-in and test prep.   Please follow these instructions carefully (unless otherwise directed):  Hold all erectile dysfunction medications at least 3 days (72 hrs) prior to test.  On the Night Before the Test: . Be sure to Drink plenty of water. . Do not consume any caffeinated/decaffeinated beverages or chocolate 12 hours prior to your test. . Do not take any antihistamines 12 hours prior to your test. . If you take Metformin do not take 24 hours prior to test. . If the patient has contrast allergy:  On the Day of the Test: . Drink plenty of water. Do not drink any water within one hour of the test. . Do not eat any food 4 hours prior to the test. . You may take your regular medications prior to the test.  . Take metoprolol (Lopressor) two hours prior to test. . HOLD Furosemide/Hydrochlorothiazide morning of the test. . FEMALES- please wear underwire-free bra if available       After the Test: . Drink plenty of water. . After receiving IV contrast, you may experience a mild flushed feeling. This is normal. . On occasion, you may experience a mild rash up to 24 hours after the test. This is not dangerous.  If this occurs, you can take Benadryl 25 mg and increase your fluid intake. . If you experience trouble breathing, this can be serious. If it is severe call 911 IMMEDIATELY. If it is mild, please call our office. . If you take any of these medications: Glipizide/Metformin, Avandament, Glucavance, please do not take 48 hours after completing test unless otherwise instructed.   Once we have confirmed authorization from your insurance company, we will call you to set up a date and time for your test.   For non-scheduling related questions, please contact the cardiac imaging nurse navigator should you have any questions/concerns: Rockwell Alexandria, RN Navigator Cardiac Imaging [facility] [phone] office  For scheduling needs, including cancellations and rescheduling, please call [phone].      Signed, Lenna Gilford. Flora Lipps, MD [facility]  [address] [place], [place] [address] [phone]  05/19/2019 11:27 AM

## 2019-05-19 ENCOUNTER — Ambulatory Visit (INDEPENDENT_AMBULATORY_CARE_PROVIDER_SITE_OTHER): Payer: 59 | Admitting: Cardiovascular Disease

## 2019-05-19 ENCOUNTER — Other Ambulatory Visit: Payer: Self-pay

## 2019-05-19 ENCOUNTER — Encounter: Payer: Self-pay | Admitting: Cardiovascular Disease

## 2019-05-19 VITALS — BP 148/98 | HR 68 | Ht 69.0 in | Wt 240.4 lb

## 2019-05-19 DIAGNOSIS — R0602 Shortness of breath: Secondary | ICD-10-CM

## 2019-05-19 DIAGNOSIS — I10 Essential (primary) hypertension: Secondary | ICD-10-CM

## 2019-05-19 DIAGNOSIS — R079 Chest pain, unspecified: Secondary | ICD-10-CM

## 2019-05-19 MED ORDER — AMLODIPINE BESYLATE 10 MG PO TABS: 10.0000 mg | ORAL_TABLET | Freq: Every day | ORAL | 3 refills | Status: DC

## 2019-05-19 MED ORDER — METOPROLOL TARTRATE 100 MG PO TABS: ORAL_TABLET | ORAL | 0 refills | Status: DC

## 2019-05-19 NOTE — Patient Instructions (Addendum)
Medication Instructions:  Start Norvasc 10 mg daily   *If you need a refill on your cardiac medications before your next appointment, please call your pharmacy*   Lab Work: BMET, BNP, CBC, TSH, A1C, LIPID today   BMET one week before CT If you have labs (blood work) drawn today and your tests are completely normal, you will receive your results only by: Marland Kitchen MyChart Message (if you have MyChart) OR . A paper copy in the mail If you have any lab test that is abnormal or we need to change your treatment, we will call you to review the results.   Testing/Procedures: Echocardiogram - Your physician has requested that you have an echocardiogram. Echocardiography is a painless test that uses sound waves to create images of your heart. It provides your doctor with information about the size and shape of your heart and how well your heart's chambers and valves are working. This procedure takes approximately one hour. There are no restrictions for this procedure. This will be performed at our [facility] location - [address].  Cardiac CTA   Follow-Up: At [facility], you and your health needs are our priority.  As part of our continuing mission to provide you with exceptional heart care, we have created designated Provider Care Teams.  These Care Teams include your primary Cardiologist (physician) and Advanced Practice Providers (APPs -  Physician Assistants and Nurse Practitioners) who all work together to provide you with the care you need, when you need it.  We recommend signing up for the patient portal called "MyChart".  Sign up information is provided on this After Visit Summary.  MyChart is used to connect with patients for Virtual Visits (Telemedicine).  Patients are able to view lab/test results, encounter notes, upcoming appointments, etc.  Non-urgent messages can be sent to your provider as well.   To learn more about what you can do with MyChart, go to  [email or web address].    Your next appointment:   3 month(s)  The format for your next appointment:   In Person  Provider:   Lennie Odor, MD   Other Instructions Your cardiac CT will be scheduled at one of the below locations:   [facility] [address] [place], [place] [address] [phone]  If scheduled at [facility], please arrive at the [facility] main entrance of [facility] 30 minutes prior to test start time. Proceed to the [facility] Radiology Department (first floor) to check-in and test prep.   Please follow these instructions carefully (unless otherwise directed):  Hold all erectile dysfunction medications at least 3 days (72 hrs) prior to test.  On the Night Before the Test: . Be sure to Drink plenty of water. . Do not consume any caffeinated/decaffeinated beverages or chocolate 12 hours prior to your test. . Do not take any antihistamines 12 hours prior to your test. . If you take Metformin do not take 24 hours prior to test. . If the patient has contrast allergy:  On the Day of the Test: . Drink plenty of water. Do not drink any water within one hour of the test. . Do not eat any food 4 hours prior to the test. . You may take your regular medications prior to the test.  . Take metoprolol (Lopressor) two hours prior to test. . HOLD Furosemide/Hydrochlorothiazide morning of the test. . FEMALES- please wear underwire-free bra if available       After the  Test: . Drink plenty of water. . After receiving IV contrast, you may experience a mild flushed feeling. This is normal. . On occasion, you may experience a mild rash up to 24 hours after the test. This is not dangerous. If this occurs, you can take Benadryl 25 mg and increase your fluid intake. . If you experience trouble breathing, this can be serious. If it is severe call 911 IMMEDIATELY. If it is mild, please call our office. . If you take any of these  medications: Glipizide/Metformin, Avandament, Glucavance, please do not take 48 hours after completing test unless otherwise instructed.   Once we have confirmed authorization from your insurance company, we will call you to set up a date and time for your test.   For non-scheduling related questions, please contact the cardiac imaging nurse navigator should you have any questions/concerns: Marchia Bond, RN Navigator Cardiac Imaging [facility] [phone] office  For scheduling needs, including cancellations and rescheduling, please call [phone].

## 2019-05-20 LAB — BASIC METABOLIC PANEL
BUN/Creatinine Ratio: 10 (ref 9–20)
BUN: 13 mg/dL (ref 6–24)
CO2: 20 mmol/L (ref 20–29)
Calcium: 8.8 mg/dL (ref 8.7–10.2)
Chloride: 99 mmol/L (ref 96–106)
Creatinine, Ser: 1.28 mg/dL — ABNORMAL HIGH (ref 0.76–1.27)
GFR calc Af Amer: 76 mL/min/{1.73_m2} (ref >59)
GFR calc non Af Amer: 66 mL/min/{1.73_m2} (ref >59)
Glucose: 94 mg/dL (ref 65–99)
Potassium: 4 mmol/L (ref 3.5–5.2)
Sodium: 135 mmol/L (ref 134–144)

## 2019-05-20 LAB — CBC
Hematocrit: 51.3 % — ABNORMAL HIGH (ref 37.5–51.0)
Hemoglobin: 17.3 g/dL (ref 13.0–17.7)
MCH: 30.5 pg (ref 26.6–33.0)
MCHC: 33.7 g/dL (ref 31.5–35.7)
MCV: 90 fL (ref 79–97)
Platelets: 173 10*3/uL (ref 150–450)
RBC: 5.68 x10E6/uL (ref 4.14–5.80)
RDW: 11.9 % (ref 11.6–15.4)
WBC: 3.4 10*3/uL (ref 3.4–10.8)

## 2019-05-20 LAB — LIPID PANEL
Chol/HDL Ratio: 4.6 ratio (ref 0.0–5.0)
Cholesterol, Total: 175 mg/dL (ref 100–199)
HDL: 38 mg/dL — ABNORMAL LOW (ref >39)
LDL Chol Calc (NIH): 111 mg/dL — ABNORMAL HIGH (ref 0–99)
Triglycerides: 148 mg/dL (ref 0–149)
VLDL Cholesterol Cal: 26 mg/dL (ref 5–40)

## 2019-05-20 LAB — HEMOGLOBIN A1C
Est. average glucose Bld gHb Est-mCnc: 88 mg/dL
Hgb A1c MFr Bld: 4.7 % — ABNORMAL LOW (ref 4.8–5.6)

## 2019-05-20 LAB — TSH: TSH: 1.15 u[IU]/mL (ref 0.450–4.500)

## 2019-05-20 LAB — BRAIN NATRIURETIC PEPTIDE: BNP: <2.5 pg/mL (ref 0.0–100.0)

## 2019-06-05 ENCOUNTER — Other Ambulatory Visit: Payer: Self-pay

## 2019-06-05 ENCOUNTER — Ambulatory Visit (HOSPITAL_COMMUNITY): Payer: 59 | Attending: Cardiology

## 2019-06-05 DIAGNOSIS — R079 Chest pain, unspecified: Secondary | ICD-10-CM | POA: Diagnosis present

## 2019-06-14 ENCOUNTER — Ambulatory Visit: Payer: 59 | Attending: Internal Medicine

## 2019-06-14 DIAGNOSIS — Z20822 Contact with and (suspected) exposure to covid-19: Secondary | ICD-10-CM

## 2019-06-15 LAB — NOVEL CORONAVIRUS, NAA: SARS-CoV-2, NAA: NOT DETECTED

## 2019-06-15 LAB — SARS-COV-2, NAA 2 DAY TAT

## 2019-06-16 ENCOUNTER — Ambulatory Visit: Payer: 59 | Attending: Internal Medicine

## 2019-06-19 ENCOUNTER — Ambulatory Visit: Payer: 59 | Attending: Internal Medicine

## 2019-06-19 DIAGNOSIS — Z23 Encounter for immunization: Secondary | ICD-10-CM

## 2019-06-19 NOTE — Progress Notes (Signed)
   Covid-19 Vaccination Clinic  Name:  Dustin Luna    MRN: [id number] DOB: 01-08-1971  06/19/2019  [name] was observed post Covid-19 immunization for 15 minutes without incident. He was provided with Vaccine Information Sheet and instruction to access the V-Safe system.   [name] was instructed to call 911 with any severe reactions post vaccine: Marland Kitchen Difficulty breathing  . Swelling of face and throat  . A fast heartbeat  . A bad rash all over body  . Dizziness and weakness   Immunizations Administered    Name Date Dose VIS Date Route   Pfizer COVID-19 Vaccine 06/19/2019 12:45 PM 0.3 mL 02/17/2019 Intramuscular   Manufacturer: [employer], [employer]   Lot: [id number]   NDC: [address]-3      Covid-19 Vaccination Clinic  Name:  Dustin Luna    MRN: [id number] DOB: 23-Jul-1970  06/19/2019  [name] was observed post Covid-19 immunization for 15 minutes without incident. He was provided with Vaccine Information Sheet and instruction to access the V-Safe system.   [name] was instructed to call 911 with any severe reactions post vaccine: Marland Kitchen Difficulty breathing  . Swelling of face and throat  . A fast heartbeat  . A bad rash all over body  . Dizziness and weakness   Immunizations Administered    Name Date Dose VIS Date Route   Pfizer COVID-19 Vaccine 06/19/2019 12:45 PM 0.3 mL 02/17/2019 Intramuscular   Manufacturer: [employer], [employer]   Lot: [id number]   NDC: [address]-6

## 2019-06-27 ENCOUNTER — Telehealth (HOSPITAL_COMMUNITY): Payer: Self-pay | Admitting: Emergency Medicine

## 2019-06-27 ENCOUNTER — Encounter (HOSPITAL_COMMUNITY): Payer: Self-pay

## 2019-06-27 NOTE — Telephone Encounter (Signed)
Reaching out to patient to offer assistance regarding upcoming cardiac imaging study; pt verbalizes understanding of appt date/time, parking situation and where to check in, pre-test NPO status and medications ordered, and verified current allergies; name and call back number provided for further questions should they arise Clary Boulais RN Navigator Cardiac [facility] [phone] office [phone] cell 

## 2019-06-28 ENCOUNTER — Ambulatory Visit (HOSPITAL_COMMUNITY)
Admission: RE | Admit: 2019-06-28 | Discharge: 2019-06-28 | Disposition: A | Discharge status: Home / Self Care | Payer: 59 | Source: Ambulatory Visit | Attending: Cardiovascular Disease | Admitting: Cardiovascular Disease

## 2019-06-28 ENCOUNTER — Encounter: Payer: 59 | Admitting: *Deleted

## 2019-06-28 ENCOUNTER — Other Ambulatory Visit: Payer: Self-pay

## 2019-06-28 DIAGNOSIS — R079 Chest pain, unspecified: Secondary | ICD-10-CM | POA: Insufficient documentation

## 2019-06-28 DIAGNOSIS — Z006 Encounter for examination for normal comparison and control in clinical research program: Secondary | ICD-10-CM

## 2019-06-28 IMAGING — CT CT HEART MORP W/ CTA COR W/ SCORE W/ CA W/CM &/OR W/O CM
1 series · 10 of 12 positions shown, 13 images · non-contrast
Comparison: None.
COMPARISON: None.

Addendum:
EXAM:
OVER-READ INTERPRETATION  CT CHEST

The following report is an over-read performed by radiologist Dr.
Blain Jumper [REDACTED] on 06/28/2019. This
over-read does not include interpretation of cardiac or coronary
anatomy or pathology. The coronary calcium score/coronary CTA
interpretation by the cardiologist is attached.
CLINICAL DATA: Chest pain
Cardiac/Coronary CTA
TECHNIQUE: The patient was scanned on a Phillips Force scanner. A 100 kV
prospective scan was triggered in the descending thoracic aorta at
111 HU's. Axial non-contrast 3 mm slices were carried out through
the heart. The data set was analyzed on a dedicated work station and
scored using the Agatson method. Gantry rotation speed was 250 msecs
and collimation was .6 mm. No beta blockade and 0.8 mg of sl NTG was
given. The 3D data set was reconstructed in 5% intervals of the
35-75 % of the R-R cycle. Diastolic phases were analyzed on a
dedicated work station using MPR, MIP and VRT modes. The patient
received 80 cc of contrast.

[Series 281: findings · 10 of 12 slices shown, 13 images]
[im 2/12  vessel]
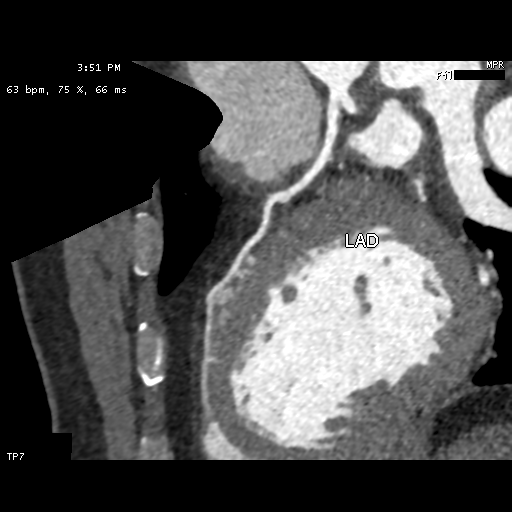
[im 2/12  lung]
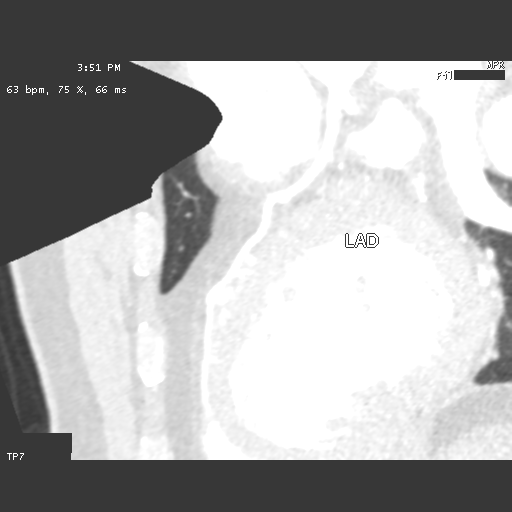
[im 3/12  vessel]
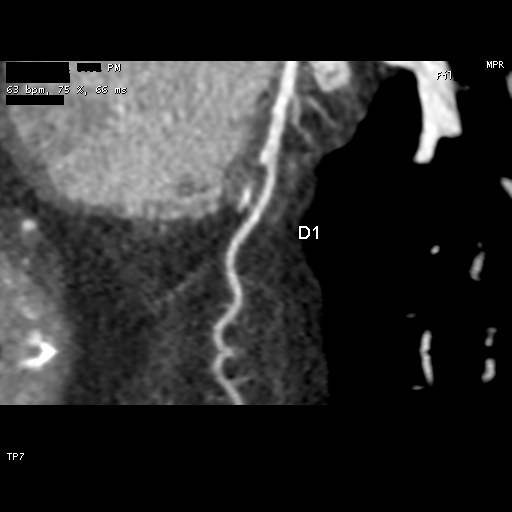
[im 4/12  vessel]
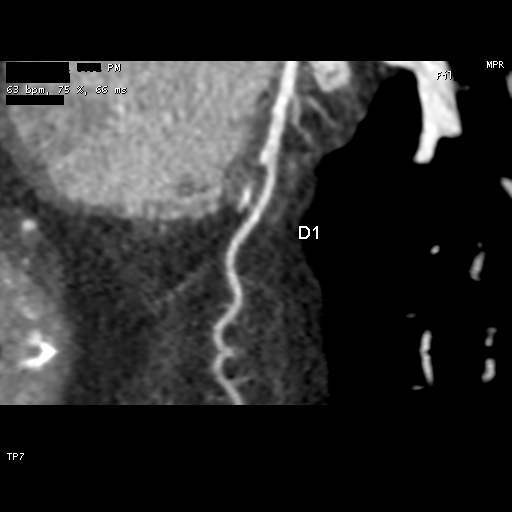
[im 5/12  vessel]
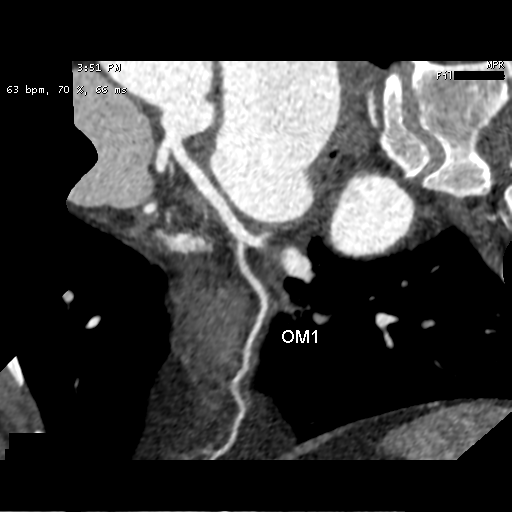
[im 6/12  vessel]
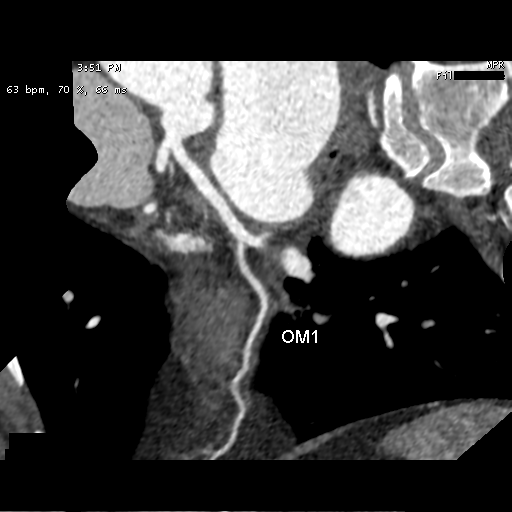
[im 6/12  lung]
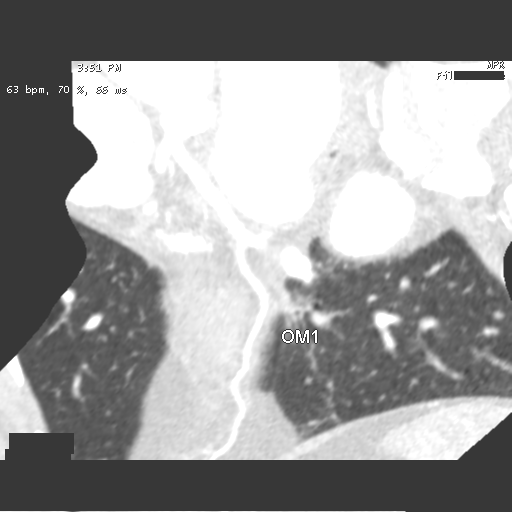
[im 7/12  vessel]
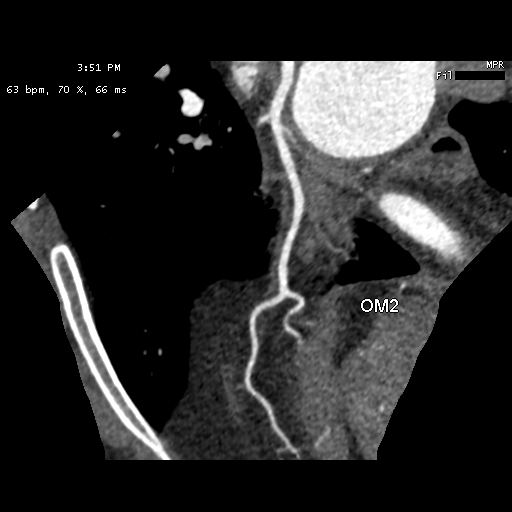
[im 8/12  vessel]
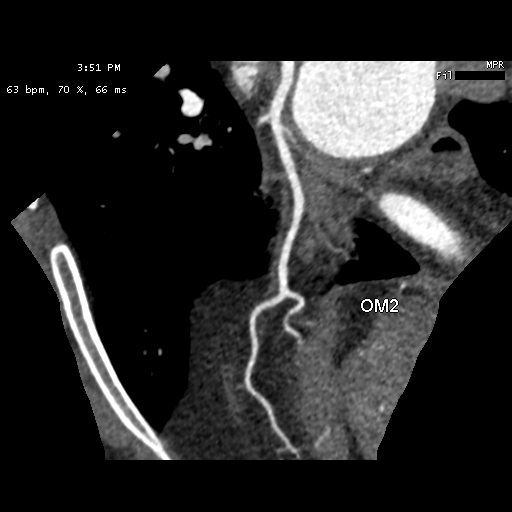
[im 9/12  vessel]
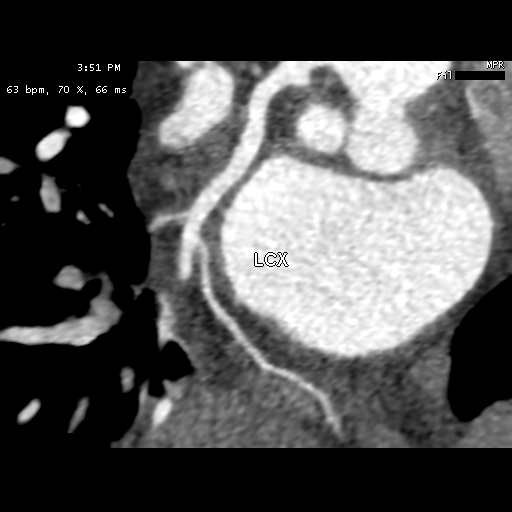
[im 10/12  vessel]
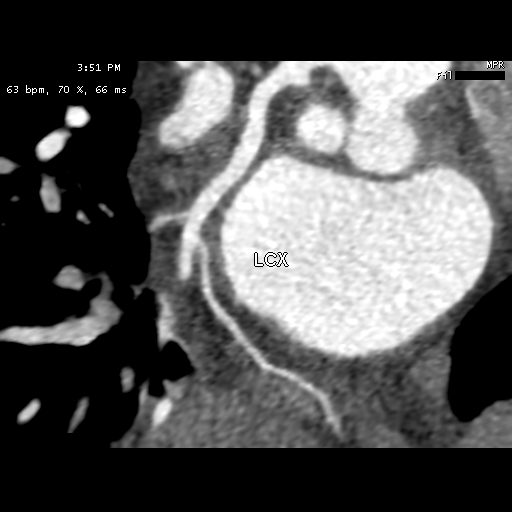
[im 10/12  lung]
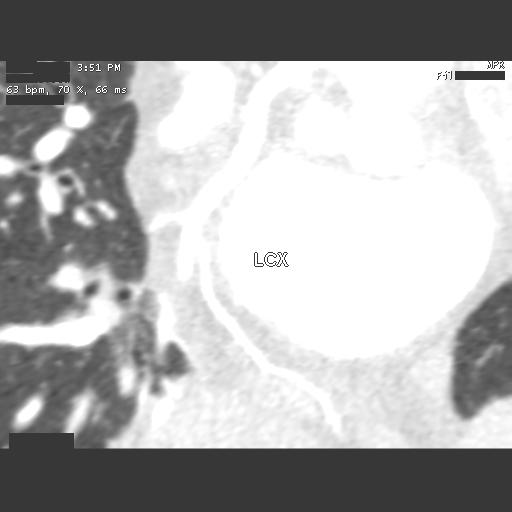
[im 11/12  vessel]
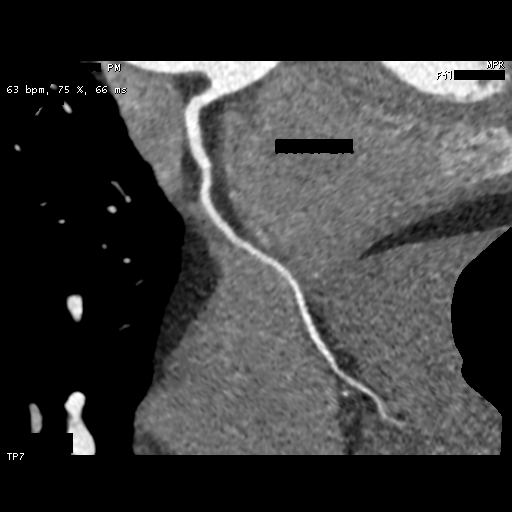

[10 of 12 positions shown; findings below may reference images not displayed]

FINDINGS: Vascular: Heart is at the upper limits of normal in size to mildly
enlarged. No pericardial effusion.

Mediastinum/Nodes: None.

Lungs/Pleura: None.

Upper Abdomen: None.

Musculoskeletal: Degenerative changes in the spine.
IMPRESSION: No acute extracardiac findings.
FINDINGS: Image quality: excellent.

Noise artifact is: Limited.

Coronary Arteries:  Normal coronary origin.  Right dominance.

Left main: The left main is a large caliber vessel with a normal
take off from the left coronary cusp that bifurcates to form a left
anterior descending artery and a left circumflex artery. There is no
plaque or stenosis.

Left anterior descending artery: The proximal LAD contains minimal
non-calcified plaque (<25%). The mid and distal LAD segments are
patent. The LAD gives off 2 patent diagonal branches.

Left circumflex artery: The LCX is non-dominant and patent with no
evidence of plaque or stenosis. OM1 contains minimal (<25%)
noncalcified plaque. OM2 is a large branch that is patent.

Right coronary artery: The RCA is dominant with normal take off from
the right coronary cusp. There is no plaque or stenosis. The RCA
terminates as a PDA and right posterolateral branch without evidence
of plaque or stenosis.

Right Atrium: Right atrial size is within normal limits.

Right Ventricle: The right ventricular cavity is within normal
limits.

Left Atrium: Left atrial size is normal in size with no left atrial
appendage filling defect.

Left Ventricle: The ventricular cavity size is within normal limits.
There are no stigmata of prior infarction. There is no abnormal
filling defect.

Pulmonary arteries: Normal in size without proximal filling defect.

Pulmonary veins: Normal pulmonary venous drainage.

Pericardium: Normal thickness with no significant effusion or
calcium present.

Cardiac valves: The aortic valve is trileaflet without significant
calcification. The mitral valve is normal structure without
significant calcification.

Aorta: Normal caliber with no significant disease.

Extra-cardiac findings: See attached radiology report for
non-cardiac structures.
IMPRESSION: 1. Coronary calcium score of 0.

2. Normal coronary origin with right dominance.

3. Minimal (<25%) noncalcified plaque in the LAD/LCX.

RECOMMENDATIONS:
1. Minimal non-obstructive CAD (0-24%). Consider non-atherosclerotic
causes of chest pain. Consider preventive therapy and risk factor
modification.

*** End of Addendum ***
EXAM:
OVER-READ INTERPRETATION  CT CHEST

The following report is an over-read performed by radiologist Dr.
Blain Jumper [REDACTED] on 06/28/2019. This
over-read does not include interpretation of cardiac or coronary
anatomy or pathology. The coronary calcium score/coronary CTA
interpretation by the cardiologist is attached.
FINDINGS: Vascular: Heart is at the upper limits of normal in size to mildly
enlarged. No pericardial effusion.

Mediastinum/Nodes: None.

Lungs/Pleura: None.

Upper Abdomen: None.

Musculoskeletal: Degenerative changes in the spine.
IMPRESSION: No acute extracardiac findings.

## 2019-06-28 MED ORDER — IOHEXOL 350 MG/ML SOLN
80.0000 mL | Freq: Once | INTRAVENOUS | Status: AC | PRN
  Administered 2019-06-28: 80 mL via INTRAVENOUS

## 2019-06-28 MED ORDER — NITROGLYCERIN 0.4 MG SL SUBL
0.8000 mg | SUBLINGUAL_TABLET | Freq: Once | SUBLINGUAL | Status: AC
  Administered 2019-06-28: 0.8 mg via SUBLINGUAL

## 2019-06-28 MED ORDER — NITROGLYCERIN 0.4 MG SL SUBL
SUBLINGUAL_TABLET | SUBLINGUAL | Status: AC
  Filled 2019-06-28: qty 2

## 2019-06-28 NOTE — Research (Signed)
CADFEM Informed Consent                  Subject Name:   Dustin Luna   Subject met inclusion and exclusion criteria.  The informed consent form, study requirements and expectations were reviewed with the subject and questions and concerns were addressed prior to the signing of the consent form.  The subject verbalized understanding of the trial requirements.  The subject agreed to participate in the CADFEM trial and signed the informed consent.  The informed consent was obtained prior to performance of any protocol-specific procedures for the subject.  A copy of the signed informed consent was given to the subject and a copy was placed in the subject's medical record.   [place] Keeara Frees, Research Assistant 06/28/2019 14:11 p.m.

## 2019-07-11 ENCOUNTER — Ambulatory Visit: Payer: 59 | Attending: Internal Medicine

## 2019-07-11 DIAGNOSIS — Z23 Encounter for immunization: Secondary | ICD-10-CM

## 2019-07-11 NOTE — Progress Notes (Signed)
   Covid-19 Vaccination Clinic  Name:  Dustin Luna    MRN: [id number] DOB: 09-Mar-1971  07/11/2019  Dustin Luna was observed post Covid-19 immunization for 15 minutes without incident. He was provided with Vaccine Information Sheet and instruction to access the V-Safe system.   Dustin Luna was instructed to call 911 with any severe reactions post vaccine: Marland Kitchen Difficulty breathing  . Swelling of face and throat  . A fast heartbeat  . A bad rash all over body  . Dizziness and weakness   Immunizations Administered    Name Date Dose VIS Date Route   Pfizer COVID-19 Vaccine 07/11/2019  9:58 AM 0.3 mL 05/03/2018 Intramuscular   Manufacturer: [employer], [employer]   Lot: [id number]   NDC: [address]-8

## 2019-08-23 NOTE — Progress Notes (Deleted)
Cardiology Office Note:   Date:  08/23/2019  NAME:  Dustin Luna    MRN: [id number] DOB:  1970-05-17   PCP:  Joana Reamer, DO  Cardiologist:  No primary care provider on file.  Electrophysiologist:  None   Referring MD: Joana Reamer, DO   No chief complaint on file. ***  History of Present Illness:   Dustin Luna is a 49 y.o. male with a hx of HTN, HLD who presents for follow-up of chest pain. CCTA with minimal non-calcified CAD. Echo normal.   Problem List 1. Non-cardiac chest pain -CCTA with minimal, non-calcified plaque (<25%).  -CAC score =0 2. HTN 3. HLD -T chol 175, HDL 38, LDL 111, TG 148  Past Medical History: Past Medical History:  Diagnosis Date  . Hypertension     Past Surgical History: Past Surgical History:  Procedure Laterality Date  . NO PAST SURGERIES      Current Medications: No outpatient medications have been marked as taking for the 08/24/19 encounter (Appointment) with O'Neal, Ronnald Ramp, MD.     Allergies:    Patient has no known allergies.   Social History: Social History   Socioeconomic History  . Marital status: Married    Spouse name: Not on file  . Number of children: 5  . Years of education: Not on file  . Highest education level: Not on file  Occupational History  . Not on file  Tobacco Use  . Smoking status: Never Smoker  . Smokeless tobacco: Never Used  Substance and Sexual Activity  . Alcohol use: Yes    Comment: Once a week  . Drug use: No  . Sexual activity: Yes    Partners: Female    Birth control/protection: Condom  Other Topics Concern  . Not on file  Social History Narrative  . Not on file   Social Determinants of Health   Financial Resource Strain:   . Difficulty of Paying Living Expenses:   Food Insecurity:   . Worried About Programme researcher, broadcasting/film/video in the Last Year:   . Barista in the Last Year:   Transportation Needs:   . Freight forwarder (Medical):   Marland Kitchen Lack of  Transportation (Non-Medical):   Physical Activity:   . Days of Exercise per Week:   . Minutes of Exercise per Session:   Stress:   . Feeling of Stress :   Social Connections:   . Frequency of Communication with Friends and Family:   . Frequency of Social Gatherings with Friends and Family:   . Attends Religious Services:   . Active Member of Clubs or Organizations:   . Attends Banker Meetings:   Marland Kitchen Marital Status:      Family History: The patient's ***family history includes Cancer in his mother; Emphysema in his mother; Heart disease in his father; Leukemia in his father; Other in his brother and father.  ROS:   All other ROS reviewed and negative. Pertinent positives noted in the HPI.     EKGs/Labs/Other Studies Reviewed:   The following studies were personally reviewed by me today:  EKG:  EKG is *** ordered today.  The ekg ordered today demonstrates ***, and was personally reviewed by me.   Echo 06/05/2019 1. Normal LV systolic function; mild LVH; grade 1 diastolic dysfunction.  2. Left ventricular ejection fraction, by estimation, is 55 to 60%. The  left ventricle has normal function. The left ventricle has no regional  wall  motion abnormalities. There is mild left ventricular hypertrophy.  Left ventricular diastolic parameters  are consistent with Grade I diastolic dysfunction (impaired relaxation).  3. Right ventricular systolic function is normal. The right ventricular  size is normal.  4. The mitral valve is normal in structure. No evidence of mitral valve  regurgitation. No evidence of mitral stenosis.  5. The aortic valve is tricuspid. Aortic valve regurgitation is not  visualized. No aortic stenosis is present.  6. The inferior vena cava is normal in size with greater than 50%  respiratory variability, suggesting right atrial pressure of 3 mmHg.   IMPRESSION: 1. Coronary calcium score of 0.  2. Normal coronary origin with right  dominance.  3. Minimal (<25%) noncalcified plaque in the LAD/LCX.  RECOMMENDATIONS: 1. Minimal non-obstructive CAD (0-24%). Consider non-atherosclerotic causes of chest pain. Consider preventive therapy and risk factor modification.  Recent Labs: 05/19/2019: BNP <2.5; BUN 13; Creatinine, Ser 1.28; Hemoglobin 17.3; Platelets 173; Potassium 4.0; Sodium 135; TSH 1.150   Recent Lipid Panel    Component Value Date/Time   CHOL 175 05/19/2019 1113   TRIG 148 05/19/2019 1113   HDL 38 (L) 05/19/2019 1113   CHOLHDL 4.6 05/19/2019 1113   CHOLHDL 5 01/08/2014 1040   VLDL 49.8 (H) 01/08/2014 1040   LDLCALC 111 (H) 05/19/2019 1113   LDLDIRECT 73.7 01/08/2014 1040    Physical Exam:   VS:  There were no vitals taken for this visit.   Wt Readings from Last 3 Encounters:  05/19/19 240 lb 6.4 oz (109 kg)  07/27/17 229 lb (103.9 kg)  04/21/17 232 lb 9.6 oz (105.5 kg)    General: Well nourished, well developed, in no acute distress Heart: Atraumatic, normal size  Eyes: PEERLA, EOMI  Neck: Supple, no JVD Endocrine: No thryomegaly Cardiac: Normal S1, S2; RRR; no murmurs, rubs, or gallops Lungs: Clear to auscultation bilaterally, no wheezing, rhonchi or rales  Abd: Soft, nontender, no hepatomegaly  Ext: No edema, pulses 2+ Musculoskeletal: No deformities, BUE and BLE strength normal and equal Skin: Warm and dry, no rashes   Neuro: Alert and oriented to person, place, time, and situation, CNII-XII grossly intact, no focal deficits  Psych: Normal mood and affect   ASSESSMENT:   Dustin Luna is a 49 y.o. male who presents for the following: No diagnosis found.  PLAN:   There are no diagnoses linked to this encounter.  Disposition: No follow-ups on file.  Medication Adjustments/Labs and Tests Ordered: Current medicines are reviewed at length with the patient today.  Concerns regarding medicines are outlined above.  No orders of the defined types were placed in this encounter.  No  orders of the defined types were placed in this encounter.   There are no Patient Instructions on file for this visit.   Time Spent with Patient: I have spent a total of *** minutes with patient reviewing hospital notes, telemetry, EKGs, labs and examining the patient as well as establishing an assessment and plan that was discussed with the patient.  > 50% of time was spent in direct patient care.  Signed, Addison Naegeli. Audie Box, [place]  [address], [place], [place] [address] [phone]  08/23/2019 7:01 AM

## 2019-08-24 ENCOUNTER — Ambulatory Visit: Payer: 59 | Admitting: Cardiovascular Disease

## 2019-08-29 NOTE — Progress Notes (Signed)
Cardiology Office Note:   Date:  08/30/2019  NAME:  Dustin Luna    MRN: [id number] DOB:  1970/05/23   PCP:  Dustin Reamer, DO  Cardiologist:  No primary care provider on file.  Electrophysiologist:  None   Referring MD: Dustin Reamer, DO   Chief Complaint  Patient presents with  . Follow-up   History of Present Illness:   Dustin Luna is a 49 y.o. male with a hx of hypertension who presents for follow-up of chest pain.  Underwent cardiac CTA which showed minimal nonobstructive CAD.  Echocardiogram normal.  He reports he is doing well since her last visit.  He had no further episodes of chest pain.  He reports has not returned to work.  He reports that he has not started any structured exercise yet.  I did inform him that this is okay to do.  His blood pressure today is 122/84 without medication.  He apparently is out of his amlodipine.  I did inform him that he can continue to hold this medication for now.  Given his nonobstructive CAD he will need to be on aspirin 81 mg daily and Crestor.  His most recent LDL cholesterol was around 111.  Goal will be less than 70.  He is not diabetic.  Overall seems to be doing well.  He denies chest pain, shortness of breath or palpitations today.  He does have complaints of leg pain and I have informed him that he will need to see his primary care physician for this.  We will give him information about establishing with a primary care physician.   Problem List 1. Minimal non-obstructive CAD  -CAC score 0 -CCTA 06/28/2019 2. HTN 3. HLD -T chol 175, HDL 38, LDL 111 -A1c 4.7  Past Medical History: Past Medical History:  Diagnosis Date  . Hypertension     Past Surgical History: Past Surgical History:  Procedure Laterality Date  . NO PAST SURGERIES      Current Medications: Current Meds  Medication Sig  . [DISCONTINUED] metoprolol tartrate (LOPRESSOR) 100 MG tablet Take 1 tablet by mouth once for procedure.  . [DISCONTINUED]  ranitidine (ZANTAC) 150 MG tablet Take 1 tablet (150 mg total) by mouth 2 (two) times daily.     Allergies:    Patient has no known allergies.   Social History: Social History   Socioeconomic History  . Marital status: Married    Spouse name: Not on file  . Number of children: 5  . Years of education: Not on file  . Highest education level: Not on file  Occupational History  . Not on file  Tobacco Use  . Smoking status: Never Smoker  . Smokeless tobacco: Never Used  Substance and Sexual Activity  . Alcohol use: Yes    Comment: Once a week  . Drug use: No  . Sexual activity: Yes    Partners: Female    Birth control/protection: Condom  Other Topics Concern  . Not on file  Social History Narrative  . Not on file   Social Determinants of Health   Financial Resource Strain:   . Difficulty of Paying Living Expenses:   Food Insecurity:   . Worried About Programme researcher, broadcasting/film/video in the Last Year:   . Barista in the Last Year:   Transportation Needs:   . Freight forwarder (Medical):   Marland Kitchen Lack of Transportation (Non-Medical):   Physical Activity:   . Days of Exercise  per Week:   . Minutes of Exercise per Session:   Stress:   . Feeling of Stress :   Social Connections:   . Frequency of Communication with Friends and Family:   . Frequency of Social Gatherings with Friends and Family:   . Attends Religious Services:   . Active Member of Clubs or Organizations:   . Attends Archivist Meetings:   Marland Kitchen Marital Status:      Family History: The patient's family history includes Cancer in his mother; Emphysema in his mother; Heart disease in his father; Leukemia in his father; Other in his brother and father.  ROS:   All other ROS reviewed and negative. Pertinent positives noted in the HPI.     EKGs/Labs/Other Studies Reviewed:   The following studies were personally reviewed by me today:  EKG:  EKG is ordered today.  The ekg ordered today demonstrates  normal sinus rhythm, heart rate 71, no acute changes, no evidence for infarction, and was personally reviewed by me.   TTE 06/05/2019 1. Normal LV systolic function; mild LVH; grade 1 diastolic dysfunction.  2. Left ventricular ejection fraction, by estimation, is 55 to 60%. The  left ventricle has normal function. The left ventricle has no regional  wall motion abnormalities. There is mild left ventricular hypertrophy.  Left ventricular diastolic parameters  are consistent with Grade I diastolic dysfunction (impaired relaxation).  3. Right ventricular systolic function is normal. The right ventricular  size is normal.  4. The mitral valve is normal in structure. No evidence of mitral valve  regurgitation. No evidence of mitral stenosis.  5. The aortic valve is tricuspid. Aortic valve regurgitation is not  visualized. No aortic stenosis is present.  6. The inferior vena cava is normal in size with greater than 50%  respiratory variability, suggesting right atrial pressure of 3 mmHg.   CCTA 06/28/2019 IMPRESSION: 1. Coronary calcium score of 0.  2. Normal coronary origin with right dominance.  3. Minimal (<25%) noncalcified plaque in the LAD/LCX.  RECOMMENDATIONS: 1. Minimal non-obstructive CAD (0-24%). Consider non-atherosclerotic causes of chest pain. Consider preventive therapy and risk factor modification.  Recent Labs: 05/19/2019: BNP <2.5; BUN 13; Creatinine, Ser 1.28; Hemoglobin 17.3; Platelets 173; Potassium 4.0; Sodium 135; TSH 1.150   Recent Lipid Panel    Component Value Date/Time   CHOL 175 05/19/2019 1113   TRIG 148 05/19/2019 1113   HDL 38 (L) 05/19/2019 1113   CHOLHDL 4.6 05/19/2019 1113   CHOLHDL 5 01/08/2014 1040   VLDL 49.8 (H) 01/08/2014 1040   LDLCALC 111 (H) 05/19/2019 1113   LDLDIRECT 73.7 01/08/2014 1040    Physical Exam:   VS:  BP 122/84 (BP Location: Left Arm, Patient Position: Sitting, Cuff Size: Large)   Pulse 71   Temp (!) 97.4 F  (36.3 C)   Ht 5\' 9"  (1.753 m)   Wt 246 lb (111.6 kg)   SpO2 97%   BMI 36.33 kg/m    Wt Readings from Last 3 Encounters:  08/30/19 246 lb (111.6 kg)  05/19/19 240 lb 6.4 oz (109 kg)  07/27/17 229 lb (103.9 kg)    General: Well nourished, well developed, in no acute distress Heart: Atraumatic, normal size  Eyes: PEERLA, EOMI  Neck: Supple, no JVD Endocrine: No thryomegaly Cardiac: Normal S1, S2; RRR; no murmurs, rubs, or gallops Lungs: Clear to auscultation bilaterally, no wheezing, rhonchi or rales  Abd: Soft, nontender, no hepatomegaly  Ext: No edema, pulses 2+ Musculoskeletal: No deformities, BUE  and BLE strength normal and equal Skin: Warm and dry, no rashes   Neuro: Alert and oriented to person, place, time, and situation, CNII-XII grossly intact, no focal deficits  Psych: Normal mood and affect   ASSESSMENT:   LORIK GUO is a 49 y.o. male who presents for the following: 1. Other chest pain   2. Essential hypertension   3. Mixed hyperlipidemia     PLAN:   1. Other chest pain -Minimal nonobstructive CAD on coronary CTA.  Echocardiogram normal.  Suspect this is acid reflux related.  We will continue with medicine for this.  We will add aspirin 81 mg daily.  We will add Crestor 10 mg daily.  He will see [place] yearly.  Goal LDL cholesterol less than 70.  2. Essential hypertension -Blood pressure is normal today.  We will hold on medication.  Has been on for 2 weeks.  He will establish with a primary care physician.  3. Mixed hyperlipidemia -Crestor 10 mg daily.  Goal LDL cholesterol less than 70.  Disposition: Return in about 1 year (around 08/29/2020).  Medication Adjustments/Labs and Tests Ordered: Current medicines are reviewed at length with the patient today.  Concerns regarding medicines are outlined above.  No orders of the defined types were placed in this encounter.  Meds ordered this encounter  Medications  . aspirin EC 81 MG tablet    Sig: Take 1 tablet  (81 mg total) by mouth daily. Swallow whole.    Dispense:  90 tablet    Refill:  3  . rosuvastatin (CRESTOR) 10 MG tablet    Sig: Take 1 tablet (10 mg total) by mouth daily.    Dispense:  90 tablet    Refill:  3    Patient Instructions  Medication Instructions:  Start Aspirin 81 mg daily  Start Crestor 10 mg daily  Stop BP meds.   *If you need a refill on your cardiac medications before your next appointment, please call your pharmacy*    Follow-Up: At [facility], you and your health needs are our priority.  As part of our continuing mission to provide you with exceptional heart care, we have created designated Provider Care Teams.  These Care Teams include your primary Cardiologist (physician) and Advanced Practice Providers (APPs -  Physician Assistants and Nurse Practitioners) who all work together to provide you with the care you need, when you need it.  We recommend signing up for the patient portal called "MyChart".  Sign up information is provided on this After Visit Summary.  MyChart is used to connect with patients for Virtual Visits (Telemedicine).  Patients are able to view lab/test results, encounter notes, upcoming appointments, etc.  Non-urgent messages can be sent to your provider as well.   To learn more about what you can do with MyChart, go to [email or web address].    Your next appointment:   12 month(s)  The format for your next appointment:   In Person  Provider:   Lennie Odor, MD        Time Spent with Patient: I have spent a total of 35 minutes with patient reviewing hospital notes, telemetry, EKGs, labs and examining the patient as well as establishing an assessment and plan that was discussed with the patient.  > 50% of time was spent in direct patient care.  Signed, Lenna Gilford. Flora Lipps, MD [facility]  [address], [address] [place], [place] [address] [phone]  08/30/2019 9:16 AM

## 2019-08-30 ENCOUNTER — Ambulatory Visit (INDEPENDENT_AMBULATORY_CARE_PROVIDER_SITE_OTHER): Payer: 59 | Admitting: Cardiovascular Disease

## 2019-08-30 ENCOUNTER — Encounter: Payer: Self-pay | Admitting: Cardiovascular Disease

## 2019-08-30 ENCOUNTER — Other Ambulatory Visit: Payer: Self-pay

## 2019-08-30 VITALS — BP 122/84 | HR 71 | Temp 97.4°F | Ht 69.0 in | Wt 246.0 lb

## 2019-08-30 DIAGNOSIS — R0789 Other chest pain: Secondary | ICD-10-CM | POA: Diagnosis not present

## 2019-08-30 DIAGNOSIS — E782 Mixed hyperlipidemia: Secondary | ICD-10-CM | POA: Diagnosis not present

## 2019-08-30 DIAGNOSIS — I10 Essential (primary) hypertension: Secondary | ICD-10-CM | POA: Diagnosis not present

## 2019-08-30 MED ORDER — ASPIRIN EC 81 MG PO TBEC
81.0000 mg | DELAYED_RELEASE_TABLET | Freq: Every day | ORAL | 3 refills | Status: AC
Start: 2019-08-30 — End: ?

## 2019-08-30 MED ORDER — ROSUVASTATIN CALCIUM 10 MG PO TABS
10.0000 mg | ORAL_TABLET | Freq: Every day | ORAL | 3 refills | Status: AC
Start: 2019-08-30 — End: 2019-11-28

## 2019-08-30 NOTE — Patient Instructions (Signed)
Medication Instructions:  Start Aspirin 81 mg daily  Start Crestor 10 mg daily  Stop BP meds.   *If you need a refill on your cardiac medications before your next appointment, please call your pharmacy*    Follow-Up: At [facility], you and your health needs are our priority.  As part of our continuing mission to provide you with exceptional heart care, we have created designated Provider Care Teams.  These Care Teams include your primary Cardiologist (physician) and Advanced Practice Providers (APPs -  Physician Assistants and Nurse Practitioners) who all work together to provide you with the care you need, when you need it.  We recommend signing up for the patient portal called "MyChart".  Sign up information is provided on this After Visit Summary.  MyChart is used to connect with patients for Virtual Visits (Telemedicine).  Patients are able to view lab/test results, encounter notes, upcoming appointments, etc.  Non-urgent messages can be sent to your provider as well.   To learn more about what you can do with MyChart, go to [email or web address].    Your next appointment:   12 month(s)  The format for your next appointment:   In Person  Provider:   Lennie Odor, MD

## 2019-09-15 NOTE — Addendum Note (Signed)
Addended by: Tacy Dura on: 09/15/2019 02:26 PM   Modules accepted: Orders

## 2019-10-03 ENCOUNTER — Other Ambulatory Visit: Payer: Self-pay

## 2019-10-03 ENCOUNTER — Ambulatory Visit (INDEPENDENT_AMBULATORY_CARE_PROVIDER_SITE_OTHER): Payer: 59 | Admitting: Family Medicine

## 2019-10-03 VITALS — BP 128/84 | HR 86

## 2019-10-03 DIAGNOSIS — J019 Acute sinusitis, unspecified: Secondary | ICD-10-CM | POA: Insufficient documentation

## 2019-10-03 DIAGNOSIS — H66003 Acute suppurative otitis media without spontaneous rupture of ear drum, bilateral: Secondary | ICD-10-CM | POA: Diagnosis not present

## 2019-10-03 MED ORDER — AMOXICILLIN-POT CLAVULANATE 875-125 MG PO TABS: 1.0000 | ORAL_TABLET | Freq: Two times a day (BID) | ORAL | 0 refills | Status: AC

## 2019-10-03 NOTE — Assessment & Plan Note (Signed)
Exam consistent with signs of acute otitis media likely in secondary to acute rhinorrhea sinusitis.  Although viral etiology is likely, will empirically treat given exam findings. Treat with Augmentin twice daily x5 days Follow-up if no improvement or worsening

## 2019-10-03 NOTE — Progress Notes (Signed)
   Subjective:   Patient ID: Dustin Luna    DOB: 1970-05-22, 49 y.o. male   MRN: [id number]  Dustin Luna is a 49 y.o. male with a history of situational syncope, GERD, renal insufficiency, atypical chest pain, chronic cough, mixed hyperlipidemia, prehypertension, here for runny nose.  Runny nose: Patient presenting with runny nose for 4 days.  Has had to use a whole case of tissues due to his rhinorrhea.  He notes his nasal discharge is clear.  He does endorse postnasal drip with occasional phlegm that is yellow in color.  He endorsed some ear congestion particularly in his right ear.  It initially felt like a full sensation but then became painful.  He denies any ear discharge or prior swimming.  He denies any fevers, chills, shortness of breath, nausea, vomiting, sore throat.  He does note that he has been vaccinated with [employer].  He endorses a recent sick contact a week prior to his symptoms occurring.  He has been treating his symptoms with NyQuil, Robitussin, Mucinex which provided temporary relief.  Review of Systems:  Per HPI.   Objective:   BP 128/84   Pulse 86   SpO2 96%  Vitals and nursing note reviewed.  General: Pleasant older male, sitting comfortably in exam chair, well nourished, well developed, in no acute distress with non-toxic appearance HEENT: moist mucous membranes, some erythema and cobblestoning in posterior oropharynx without tonsillar erythema or exudate, R ear notable for erythematous tympanic membrane however good cone of light and nonbulging, left ear with significant bulging and erythema with fluid, air, bubbles present behind tympanic membrane, nares with inflamed turbinates in L>R nose, no facial pain to palpation Lungs: clear to auscultation bilaterally with normal work of breathing on room air Skin: warm, dry Extremities: warm and well perfused MSK: gait normal Neuro: Alert and oriented, speech normal  Assessment & Plan:   Acute  rhinosinusitis History and physical exam appear most consistent with acute rhinosinusitis.  Most likely viral etiology.  Lack of red flag symptoms we will treat conservatively at this time -Tylenol/ibuprofen as needed discomfort -Mucinex, Flonase, Zyrtec daily -Recommended saline irrigation -Follow-up no improvement or worsening symptoms  Non-recurrent acute suppurative otitis media of both ears without spontaneous rupture of tympanic membranes Exam consistent with signs of acute otitis media likely in secondary to acute rhinorrhea sinusitis.  Although viral etiology is likely, will empirically treat given exam findings. Treat with Augmentin twice daily x5 days Follow-up if no improvement or worsening  No orders of the defined types were placed in this encounter.  Meds ordered this encounter  Medications  . amoxicillin-clavulanate (AUGMENTIN) 875-125 MG tablet    Sig: Take 1 tablet by mouth 2 (two) times daily for 5 days.    Dispense:  10 tablet    Refill:  0    Orpah Cobb, DO PGY-3, [facility] 10/03/2019 9:08 PM

## 2019-10-03 NOTE — Patient Instructions (Signed)
Thank you for coming to see me today. It was a pleasure to see you.   I believe that you have a viral upper respiratory infection with a concurrent ear infection. For your ear infection please take the Augmentin twice a day for 5 days. For your other symptoms you may treat with Mucinex daily, Flonase in each nostril daily, Zyrtec/Claritin, and Tylenol/ibuprofen as needed for discomfort.  Please follow-up if your symptoms do not improve or worsen including if things appear to be improving and then worsen, high fevers, productive cough, or inability to stay well-hydrated.  If you have any questions or concerns, please do not hesitate to call the office at [phone].  Take Care,  Dr. Orpah Cobb, DO Resident Physician [facility] [phone]

## 2019-10-03 NOTE — Assessment & Plan Note (Signed)
History and physical exam appear most consistent with acute rhinosinusitis.  Most likely viral etiology.  Lack of red flag symptoms we will treat conservatively at this time -Tylenol/ibuprofen as needed discomfort -Mucinex, Flonase, Zyrtec daily -Recommended saline irrigation -Follow-up no improvement or worsening symptoms

## 2019-10-10 ENCOUNTER — Other Ambulatory Visit: Payer: 59

## 2019-10-10 ENCOUNTER — Other Ambulatory Visit: Payer: Self-pay

## 2019-10-10 DIAGNOSIS — Z20822 Contact with and (suspected) exposure to covid-19: Secondary | ICD-10-CM

## 2019-10-11 LAB — NOVEL CORONAVIRUS, NAA: SARS-CoV-2, NAA: NOT DETECTED

## 2019-10-11 LAB — SARS-COV-2, NAA 2 DAY TAT

## 2020-01-03 ENCOUNTER — Ambulatory Visit (HOSPITAL_COMMUNITY)
Admission: EM | Admit: 2020-01-03 | Discharge: 2020-01-03 | Disposition: A | Discharge status: Home / Self Care | Payer: 59 | Attending: Family Medicine | Admitting: Family Medicine

## 2020-01-03 ENCOUNTER — Encounter (HOSPITAL_COMMUNITY): Payer: Self-pay | Admitting: *Deleted

## 2020-01-03 ENCOUNTER — Other Ambulatory Visit: Payer: Self-pay

## 2020-01-03 DIAGNOSIS — N4889 Other specified disorders of penis: Secondary | ICD-10-CM | POA: Diagnosis not present

## 2020-01-03 DIAGNOSIS — R3129 Other microscopic hematuria: Secondary | ICD-10-CM | POA: Insufficient documentation

## 2020-01-03 DIAGNOSIS — R030 Elevated blood-pressure reading, without diagnosis of hypertension: Secondary | ICD-10-CM | POA: Diagnosis present

## 2020-01-03 LAB — POCT URINALYSIS DIPSTICK, ED / UC
Bilirubin Urine: NEGATIVE
Glucose, UA: NEGATIVE mg/dL
Ketones, ur: NEGATIVE mg/dL
Leukocytes,Ua: NEGATIVE
Nitrite: NEGATIVE
Protein, ur: NEGATIVE mg/dL
Specific Gravity, Urine: >=1.03 (ref 1.005–1.030)
Urobilinogen, UA: 0.2 mg/dL (ref 0.0–1.0)
pH: 5.5 (ref 5.0–8.0)

## 2020-01-03 MED ORDER — LIDOCAINE HCL (PF) 1 % IJ SOLN
INTRAMUSCULAR | Status: AC
  Filled 2020-01-03: qty 2

## 2020-01-03 MED ORDER — DOXYCYCLINE HYCLATE 100 MG PO CAPS
100.0000 mg | ORAL_CAPSULE | Freq: Two times a day (BID) | ORAL | 0 refills | Status: AC
Start: 2020-01-03 — End: ?

## 2020-01-03 MED ORDER — CEFTRIAXONE SODIUM 500 MG IJ SOLR
500.0000 mg | Freq: Once | INTRAMUSCULAR | Status: AC
  Administered 2020-01-03: 500 mg via INTRAMUSCULAR

## 2020-01-03 MED ORDER — CEFTRIAXONE SODIUM 500 MG IJ SOLR
INTRAMUSCULAR | Status: AC
  Filled 2020-01-03: qty 500

## 2020-01-03 NOTE — Discharge Instructions (Addendum)
You have been given the following today for treatment of suspected gonorrhea and/or chlamydia:  cefTRIAXone (ROCEPHIN) injection 500 mg  Please pick up your prescription for doxycycline 100 mg and begin taking twice daily for the next seven (7) days.  Even though we have treated you today, we have sent testing for sexually transmitted infections. We will notify you of any positive results once they are received. If required, we will prescribe any medications you might need.  Please refrain from all sexual activity for at least the next seven days.  Your blood pressure was noted to be elevated during your visit today. If you are currently taking medication for high blood pressure, please ensure you are taking this as directed. If you do not have a history of high blood pressure and your blood pressure remains persistently elevated, you may need to begin taking a medication at some point. You may return here within the next few days to recheck if unable to see your primary care provider or if do not have a one.  BP (!) 152/104 (BP Location: Left Arm)   Pulse 66   Temp 98.1 F (36.7 C) (Oral)   Resp 16   SpO2 98%

## 2020-01-03 NOTE — ED Triage Notes (Signed)
Patient states that he is experiencing irritation to the tip of the penis. Patient denies any rash, discharge, swelling or other symptoms. Patient is requesting STD check. Patient denies any pain.

## 2020-01-03 NOTE — ED Provider Notes (Signed)
[facility]   [id number] 01/03/20 Arrival Time: 0859  ASSESSMENT & PLAN:  1. Penile irritation   2. Elevated blood pressure reading without diagnosis of hypertension   3. Microscopic hematuria     Recommend:  Follow-up Information    Mullis, Kiersten P, DO.   Specialty: Family Medicine Why: To recheck your blood pressure and to discuss the blood in your urine sample. Contact information: [address] [place] [address] [phone]                Discharge Instructions     You have been given the following today for treatment of suspected gonorrhea and/or chlamydia:  cefTRIAXone (ROCEPHIN) injection 500 mg  Please pick up your prescription for doxycycline 100 mg and begin taking twice daily for the next seven (7) days.  Even though we have treated you today, we have sent testing for sexually transmitted infections. We will notify you of any positive results once they are received. If required, we will prescribe any medications you might need.  Please refrain from all sexual activity for at least the next seven days.  Your blood pressure was noted to be elevated during your visit today. If you are currently taking medication for high blood pressure, please ensure you are taking this as directed. If you do not have a history of high blood pressure and your blood pressure remains persistently elevated, you may need to begin taking a medication at some point. You may return here within the next few days to recheck if unable to see your primary care provider or if do not have a one.  BP (!) 152/104 (BP Location: Left Arm)   Pulse 66   Temp 98.1 F (36.7 C) (Oral)   Resp 16   SpO2 98%      Pending: Labs Reviewed  POCT URINALYSIS DIPSTICK, ED / UC - Abnormal; Notable for the following components:      Result Value   Hgb urine dipstick MODERATE (*)    All other components within normal limits     Reviewed expectations re: course of current  medical issues. Questions answered. Outlined signs and symptoms indicating need for more acute intervention. Patient verbalized understanding. After Visit Summary given.   SUBJECTIVE:  Dustin Luna is a 49 y.o. male who presents with complaint of penile irriatation. No skin changes or rashes. Onset gradual. First noticed 2-3 d ago. No penile discharge. No specific dysuria. No specific aggravating or alleviating factors reported. Denies: gross hematuria. Afebrile. No abdominal or pelvic pain. No n/v. No rashes or lesions. Reports that he is sexually active with single male partner; new partner; "condom broke". OTC treatment: none. History of STI: none reported.  Increased blood pressure noted today. Reports that he has not been treated for hypertension in the past. He reports no chest pain on exertion, no dyspnea on exertion, no swelling of ankles and no palpitations.  OBJECTIVE:  Vitals:   01/03/20 0957  BP: (!) 152/104  Pulse: 66  Resp: 16  Temp: 98.1 F (36.7 C)  TempSrc: Oral  SpO2: 98%     General appearance: alert, cooperative, appears stated age and no distress Lungs: unlabored respirations; speaks full sentences without difficulty Back: FROM at waist Abdomen: soft, non-tender GU: deferred Skin: warm and dry Psychological: alert and cooperative; normal mood and affect.  Results for orders placed or performed during the hospital encounter of 01/03/20  POC Urinalysis dipstick  Result Value Ref Range   Glucose, UA  NEGATIVE NEGATIVE mg/dL   Bilirubin Urine NEGATIVE NEGATIVE   Ketones, ur NEGATIVE NEGATIVE mg/dL   Specific Gravity, Urine >=1.030 1.005 - 1.030   Hgb urine dipstick MODERATE (A) NEGATIVE   pH 5.5 5.0 - 8.0   Protein, ur NEGATIVE NEGATIVE mg/dL   Urobilinogen, UA 0.2 0.0 - 1.0 mg/dL   Nitrite NEGATIVE NEGATIVE   Leukocytes,Ua NEGATIVE NEGATIVE    Labs Reviewed  POCT URINALYSIS DIPSTICK, ED / UC - Abnormal; Notable for the following components:       Result Value   Hgb urine dipstick MODERATE (*)    All other components within normal limits    No Known Allergies  Past Medical History:  Diagnosis Date  . Hypertension    Family History  Problem Relation Age of Onset  . Other Father        cardiac stent  . Leukemia Father   . Heart disease Father   . Other Brother        cardiac stent  . Emphysema Mother   . Cancer Mother        Unspecified   Social History   Socioeconomic History  . Marital status: Married    Spouse name: Not on file  . Number of children: 5  . Years of education: Not on file  . Highest education level: Not on file  Occupational History  . Not on file  Tobacco Use  . Smoking status: Never Smoker  . Smokeless tobacco: Never Used  Vaping Use  . Vaping Use: Never used  Substance and Sexual Activity  . Alcohol use: Yes    Comment: occ  . Drug use: No  . Sexual activity: Yes    Partners: Female    Birth control/protection: Condom  Other Topics Concern  . Not on file  Social History Narrative  . Not on file   Social Determinants of Health   Financial Resource Strain:   . Difficulty of Paying Living Expenses: Not on file  Food Insecurity:   . Worried About Programme researcher, broadcasting/film/video in the Last Year: Not on file  . Ran Out of Food in the Last Year: Not on file  Transportation Needs:   . Lack of Transportation (Medical): Not on file  . Lack of Transportation (Non-Medical): Not on file  Physical Activity:   . Days of Exercise per Week: Not on file  . Minutes of Exercise per Session: Not on file  Stress:   . Feeling of Stress : Not on file  Social Connections:   . Frequency of Communication with Friends and Family: Not on file  . Frequency of Social Gatherings with Friends and Family: Not on file  . Attends Religious Services: Not on file  . Active Member of Clubs or Organizations: Not on file  . Attends Banker Meetings: Not on file  . Marital Status: Not on file  Intimate  Partner Violence:   . Fear of Current or Ex-Partner: Not on file  . Emotionally Abused: Not on file  . Physically Abused: Not on file  . Sexually Abused: Not on file          Mardella Layman, MD 01/03/20 1032

## 2020-01-04 LAB — CYTOLOGY, (ORAL, ANAL, URETHRAL) ANCILLARY ONLY
Chlamydia: NEGATIVE
Comment: NEGATIVE
Comment: NEGATIVE
Comment: NORMAL
Neisseria Gonorrhea: NEGATIVE
Trichomonas: NEGATIVE

## 2020-01-12 ENCOUNTER — Encounter: Payer: Self-pay | Admitting: Family Medicine

## 2020-03-20 ENCOUNTER — Other Ambulatory Visit: Payer: 59

## 2020-03-20 DIAGNOSIS — Z20822 Contact with and (suspected) exposure to covid-19: Secondary | ICD-10-CM

## 2020-03-22 LAB — NOVEL CORONAVIRUS, NAA: SARS-CoV-2, NAA: NOT DETECTED

## 2020-03-22 LAB — SPECIMEN STATUS REPORT

## 2020-03-22 LAB — SARS-COV-2, NAA 2 DAY TAT

## 2020-06-17 ENCOUNTER — Telehealth: Payer: Self-pay | Admitting: Cardiovascular Disease

## 2020-06-17 NOTE — Telephone Encounter (Signed)
Received a call from patient he stated he had a episode of sob and chest tightness last night lasted appox 10 min.No pain this morning.Stated he would like appointment.Appointment scheduled with Edd Fabian NP 4/12 at 10:45 am.Advised to go to ED if he has any more chest tightness.

## 2020-06-17 NOTE — Telephone Encounter (Signed)
Pt c/o of Chest Pain: STAT if CP now or developed within 24 hours  1. Are you having CP right now? No, eased away  2. Are you experiencing any other symptoms (ex. SOB, nausea, vomiting, sweating)? SOB last night  3. How long have you been experiencing CP? Started last night  4. Is your CP continuous or coming and going? Came and went twice  5. Have you taken Nitroglycerin? no   Patient states last night he had really bad chest pain. He states it was a bad twisting pain on his left side. He states it also happened a week ago. He states last night he also had SOB.

## 2020-06-17 NOTE — Progress Notes (Signed)
Cardiology Clinic Note   Patient Name: KEANTHONY POOLE Date of Encounter: 06/18/2020  Primary Care Provider:  Joana Reamer, DO Primary Cardiologist:  Reatha Harps, MD  Patient Profile    Trudi Ida. Dann 50 year old male presents the clinic today for an evaluation of his chest pain.  Past Medical History    Past Medical History:  Diagnosis Date  . Hypertension    Past Surgical History:  Procedure Laterality Date  . NO PAST SURGERIES      Allergies  No Known Allergies  History of Present Illness    Mr. Christoffel has a PMH of GERD, renal insufficiency, prehypertension, situational syncope, atypical chest pain, mixed hyperlipidemia, and chronic cough.  He underwent coronary CTA which showed minimal nonobstructive CAD.  His echocardiogram was normal.  He was seen by Dr. Flora Lipps 08/30/2019.  During that time he reported he was doing well.  He had had no further episodes of chest discomfort.  He reported he had not yet returned to work.  He had also not started any formal type of exercise.  He was informed that it was okay for him to start exercising him increase his physical activity.  His blood pressure was 122/84 without medication.  He was out of his amlodipine at that time.  He was instructed to continue to hold his medication.  He was started on aspirin 81 mg daily rosuvastatin.  His LDL cholesterol was 111.  Goal for his cholesterol is less than 70.  He denied chest pain, shortness of breath, and palpitations.  He reported leg pain and was instructed to follow-up with his PCP.  Mr. Dolman contacted nurse triage line on 06/17/2020 and reported he had an episode of chest pain on 06/16/2020.  He stated that the pain came twice.  He described it as a bad chest pain that felt like twisting on his left side.  He also reported an episode of no similar chest discomfort 1 week ago.  He reported shortness of breath with the chest discomfort on 06/16/2020.  He presents the clinic today for  evaluation states he will noted chest pain as he was going to bed on 06/16/2020.  He reports that the chest pain lasted for about 5 minutes and dissipated on its own.  He reports it was very similar to previous chest pain he.  He does not have any chest pain with exertion.  He reports the pain was sharp and over the left area of his chest.  He also describes palpitations that happen occasionally in the evenings.  He has not gone back to the gym doing formal physical activity.  He has not done any physical activity on last 2 years.  He denies any bleeding issues.  I will order a 7-day cardiac event monitor, BMP, CBC, and magnesium level.  I will have him increase physical activity as tolerated, give him a salty 6 diet sheet, and have him follow-up in 6 to 8 weeks.  Today he denies chest pain, shortness of breath, lower extremity edema, fatigue, palpitations, melena, hematuria, hemoptysis, diaphoresis, weakness, presyncope, syncope, orthopnea, and PND.   Home Medications    Prior to Admission medications   Medication Sig Start Date End Date Taking? Authorizing Provider  aspirin EC 81 MG tablet Take 1 tablet (81 mg total) by mouth daily. Swallow whole. 08/30/19   O'Neal, Ronnald Ramp, MD  doxycycline (VIBRAMYCIN) 100 MG capsule Take 1 capsule (100 mg total) by mouth 2 (two) times daily. 01/03/20  Mardella Layman, MD  rosuvastatin (CRESTOR) 10 MG tablet Take 1 tablet (10 mg total) by mouth daily. 08/30/19 11/28/19  O'NealRonnald Ramp, MD    Family History    Family History  Problem Relation Age of Onset  . Other Father        cardiac stent  . Leukemia Father   . Heart disease Father   . Other Brother        cardiac stent  . Emphysema Mother   . Cancer Mother        Unspecified   He indicated that the status of his mother is unknown. He indicated that his father is alive. He indicated that only one of his two brothers is alive.  Social History    Social History   Socioeconomic History  .  Marital status: Married    Spouse name: Not on file  . Number of children: 5  . Years of education: Not on file  . Highest education level: Not on file  Occupational History  . Not on file  Tobacco Use  . Smoking status: Never Smoker  . Smokeless tobacco: Never Used  Vaping Use  . Vaping Use: Never used  Substance and Sexual Activity  . Alcohol use: Yes    Comment: occ  . Drug use: No  . Sexual activity: Yes    Partners: Female    Birth control/protection: Condom  Other Topics Concern  . Not on file  Social History Narrative  . Not on file   Social Determinants of Health   Financial Resource Strain: Not on file  Food Insecurity: Not on file  Transportation Needs: Not on file  Physical Activity: Not on file  Stress: Not on file  Social Connections: Not on file  Intimate Partner Violence: Not on file     Review of Systems    General:  No chills, fever, night sweats or weight changes.  Cardiovascular:  No chest pain, dyspnea on exertion, edema, orthopnea, palpitations, paroxysmal nocturnal dyspnea. Dermatological: No rash, lesions/masses Respiratory: No cough, dyspnea Urologic: No hematuria, dysuria Abdominal:   No nausea, vomiting, diarrhea, bright red blood per rectum, melena, or hematemesis Neurologic:  No visual changes, wkns, changes in mental status. All other systems reviewed and are otherwise negative except as noted above.  Physical Exam    VS:  BP 132/88   Pulse 66   Ht 5\' 9"  (1.753 m)   Wt 241 lb (109.3 kg)   SpO2 98%   BMI 35.59 kg/m  , BMI Body mass index is 35.59 kg/m. GEN: Well nourished, well developed, in no acute distress. HEENT: normal. Neck: Supple, no JVD, carotid bruits, or masses. Cardiac: RRR, no murmurs, rubs, or gallops. No clubbing, cyanosis, edema.  Radials/DP/PT 2+ and equal bilaterally.  Respiratory:  Respirations regular and unlabored, clear to auscultation bilaterally. GI: Soft, nontender, nondistended, BS + x 4. MS: no  deformity or atrophy. Skin: warm and dry, no rash. Neuro:  Strength and sensation are intact. Psych: Normal affect.  Accessory Clinical Findings    Recent Labs: No results found for requested labs within last 8760 hours.   Recent Lipid Panel    Component Value Date/Time   CHOL 175 05/19/2019 1113   TRIG 148 05/19/2019 1113   HDL 38 (L) 05/19/2019 1113   CHOLHDL 4.6 05/19/2019 1113   CHOLHDL 5 01/08/2014 1040   VLDL 49.8 (H) 01/08/2014 1040   LDLCALC 111 (H) 05/19/2019 1113   LDLDIRECT 73.7 01/08/2014 1040  ECG personally reviewed by me today-normal sinus rhythm nonspecific T wave abnormality 66 bpm- No acute changes  Echocardiogram 06/05/2019  1. Normal LV systolic function; mild LVH; grade 1 diastolic dysfunction.  2. Left ventricular ejection fraction, by estimation, is 55 to 60%. The  left ventricle has normal function. The left ventricle has no regional  wall motion abnormalities. There is mild left ventricular hypertrophy.  Left ventricular diastolic parameters  are consistent with Grade I diastolic dysfunction (impaired relaxation).  3. Right ventricular systolic function is normal. The right ventricular  size is normal.  4. The mitral valve is normal in structure. No evidence of mitral valve  regurgitation. No evidence of mitral stenosis.  5. The aortic valve is tricuspid. Aortic valve regurgitation is not  visualized. No aortic stenosis is present.  6. The inferior vena cava is normal in size with greater than 50%  respiratory variability, suggesting right atrial pressure of 3 mmHg.  Coronary CTA 06/28/2019  IMPRESSION: 1. Coronary calcium score of 0.  2. Normal coronary origin with right dominance.  3. Minimal (<25%) noncalcified plaque in the LAD/LCX.  RECOMMENDATIONS: 1. Minimal non-obstructive CAD (0-24%). Consider non-atherosclerotic causes of chest pain. Consider preventive therapy and risk factor modification.  Assessment & Plan   1.   Chest pain/precordial pain -contacted nurse triage line on 06/17/2020.  EKG today shows normal sinus rhythm nonspecific T wave abnormality 66 bpm reports an episode of chest discomfort and shortness of breath for 1022.  Also reported a episode of chest discomfort 1 week prior.  Reviewed coronary CTA and previous echocardiogram.  Appears to be precordial in nature No further episodes of chest discomfort  Palpitations-notices occasional palpitations in the evening as he is going to bed.  Not associated with increased shortness of breath. Order 7-day cardiac CO monitor Order CBC, magnesium, and BMP  Essential hypertension-BP today 132/88.  Well-controlled at home. Heart healthy low-sodium diet-salty 6 given Increase physical activity as tolerated Maintain blood pressure log  Mixed hyperlipidemia-LDL 111 on 05/19/2019 Continue rosuvastatin Heart healthy low-sodium high-fiber diet Increase physical activity as tolerated Repeat fasting lipids and LFT  Disposition: Follow-up with Dr. Flora Lipps in 6 months.   Thomasene Ripple. Yolander Goodie NP-C    06/18/2020, 11:08 AM [facility] [address] Office [phone] Fax [phone]  Notice: This dictation was prepared with Dragon dictation along with smaller phrase technology. Any transcriptional errors that result from this process are unintentional and may not be corrected upon review.  I spent 12 minutes examining this patient, reviewing medications, and using patient centered shared decision making involving her cardiac care.  Prior to her visit I spent greater than 20 minutes reviewing her past medical history,  medications, and prior cardiac tests.

## 2020-06-18 ENCOUNTER — Ambulatory Visit (INDEPENDENT_AMBULATORY_CARE_PROVIDER_SITE_OTHER): Payer: 59 | Admitting: General Practice

## 2020-06-18 ENCOUNTER — Encounter: Payer: Self-pay | Admitting: General Practice

## 2020-06-18 ENCOUNTER — Ambulatory Visit (INDEPENDENT_AMBULATORY_CARE_PROVIDER_SITE_OTHER): Payer: 59

## 2020-06-18 ENCOUNTER — Other Ambulatory Visit: Payer: Self-pay

## 2020-06-18 VITALS — BP 132/88 | HR 66 | Ht 69.0 in | Wt 241.0 lb

## 2020-06-18 DIAGNOSIS — R079 Chest pain, unspecified: Secondary | ICD-10-CM

## 2020-06-18 DIAGNOSIS — R0789 Other chest pain: Secondary | ICD-10-CM | POA: Diagnosis not present

## 2020-06-18 DIAGNOSIS — E782 Mixed hyperlipidemia: Secondary | ICD-10-CM | POA: Diagnosis not present

## 2020-06-18 DIAGNOSIS — I10 Essential (primary) hypertension: Secondary | ICD-10-CM | POA: Diagnosis not present

## 2020-06-18 DIAGNOSIS — R002 Palpitations: Secondary | ICD-10-CM

## 2020-06-18 DIAGNOSIS — Z79899 Other long term (current) drug therapy: Secondary | ICD-10-CM

## 2020-06-18 NOTE — Patient Instructions (Signed)
Medication Instructions:  The current medical regimen is effective;  continue present plan and medications as directed. Please refer to the Current Medication list given to you today. *If you need a refill on your cardiac medications before your next appointment, please call your pharmacy*  Lab Work:     CBC BMET AND MAG TODAY  RETURN FASTING FOR LIPID AND LFT      Testing/Procedures:  Your physician has requested you wear your ZIO patch monitor for __7___days.-SEE BELOW  Special Instructions  PLEASE READ AND FOLLOW SALTY 6-ATTACHED-1,800mg  daily  PLEASE INCREASE PHYSICAL ACTIVITY AS TOLERATED-START WITH 15 MINUTES EVER-OTHER-DAY THEN INCREASE AS TOLERATED  Follow-Up: Your next appointment:  6-8 week(s) In Person with Lennie Odor, MD OR IF UNAVAILABLE JESSE CLEAVER, FNP-C  At [facility], you and your health needs are our priority.  As part of our continuing mission to provide you with exceptional heart care, we have created designated Provider Care Teams.  These Care Teams include your primary Cardiologist (physician) and Advanced Practice Providers (APPs -  Physician Assistants and Nurse Practitioners) who all work together to provide you with the care you need, when you need it.            6 SALTY THINGS TO AVOID     1,800MG  DAILY    ZIO XT- Long Term Monitor Instructions   This is a single patch monitor.  [employer] supplies one patch monitor per enrollment.  Additional stickers are not available.   Please do not apply patch if you will be having a Nuclear Stress Test, Echocardiogram, Cardiac CT, MRI, or Chest Xray during the time frame you would be wearing the monitor. The patch cannot be worn during these tests.  You cannot remove and re-apply the ZIO XT patch monitor.   Your ZIO patch monitor will be sent USPS Priority mail from [facility] directly to your home address. The monitor may also be mailed to a PO BOX if home delivery is not available.   It may take  3-5 days to receive your monitor after you have been enrolled.   Once you have received you monitor, please review enclosed instructions.  Your monitor has already been registered assigning a specific monitor serial # to you.   Applying the monitor   Shave hair from upper left chest.   Hold abrader disc by orange tab.  Rub abrader in 40 strokes over left upper chest as indicated in your monitor instructions.   Clean area with 4 enclosed alcohol pads .  Use all pads to assure are is cleaned thoroughly.  Let dry.   Apply patch as indicated in monitor instructions.  Patch will be place under collarbone on left side of chest with arrow pointing upward.   Rub patch adhesive wings for 2 minutes.Remove white label marked "1".  Remove white label marked "2".  Rub patch adhesive wings for 2 additional minutes.   While looking in a mirror, press and release button in center of patch.  A small green light will flash 3-4 times .  This will be your only indicator the monitor has been turned on.     Do not shower for the first 24 hours.  You may shower after the first 24 hours.   Press button if you feel a symptom. You will hear a small click.  Record Date, Time and Symptom in the Patient Log Book.   When you are ready to remove patch, follow instructions on last 2 pages of Patient  Log Book.  Stick patch monitor onto last page of Patient Log Book.   Place Patient Log Book in [place] box.  Use locking tab on box and tape box closed securely.  The [employer] and [employer] has [employer] on it.  Please place in mailbox as soon as possible.  Your physician should have your test results approximately 7 days after the monitor has been mailed back to [facility].   Call [facility] Customer Care at [phone] if you have questions regarding your ZIO XT patch monitor.  Call them immediately if you see an orange light blinking on your monitor.   If your monitor falls off in less than 4 days contact our  Monitor department at [phone].  If your monitor becomes loose or falls off after 4 days call [employer] at [phone] for suggestions on securing your monitor.

## 2020-06-25 DIAGNOSIS — R002 Palpitations: Secondary | ICD-10-CM

## 2020-08-06 ENCOUNTER — Ambulatory Visit: Payer: 59 | Admitting: General Practice

## 2020-08-14 ENCOUNTER — Telehealth: Payer: Self-pay | Admitting: Cardiovascular Disease

## 2020-08-14 NOTE — Telephone Encounter (Signed)
Pt is returning call in regards to obtaining results from his heart monitor. Please advise

## 2020-08-14 NOTE — Telephone Encounter (Signed)
Patient aware of Monitor results and verbalized understanding.

## 2020-10-02 ENCOUNTER — Ambulatory Visit: Payer: 59 | Admitting: Cardiovascular Disease

## 2021-07-29 ENCOUNTER — Ambulatory Visit (HOSPITAL_COMMUNITY)
Admission: EM | Admit: 2021-07-29 | Discharge: 2021-07-29 | Disposition: A | Discharge status: Home / Self Care | Payer: Commercial Managed Care - HMO | Attending: Physician Assistant | Admitting: Physician Assistant

## 2021-07-29 ENCOUNTER — Encounter (HOSPITAL_COMMUNITY): Payer: Self-pay

## 2021-07-29 DIAGNOSIS — Z202 Contact with and (suspected) exposure to infections with a predominantly sexual mode of transmission: Secondary | ICD-10-CM

## 2021-07-29 DIAGNOSIS — I10 Essential (primary) hypertension: Secondary | ICD-10-CM

## 2021-07-29 LAB — HIV ANTIBODY (ROUTINE TESTING W REFLEX): HIV Screen 4th Generation wRfx: NONREACTIVE

## 2021-07-29 MED ORDER — BENAZEPRIL HCL 10 MG PO TABS: 10.0000 mg | ORAL_TABLET | Freq: Every day | ORAL | 1 refills | Status: AC

## 2021-07-29 NOTE — ED Provider Notes (Signed)
[facility]    CSN: [id number] Arrival date & time: 07/29/21  0807      History   Chief Complaint Chief Complaint  Patient presents with   Exposure to STD    HPI Dustin Luna is a 51 y.o. male.   51 year old male presents for STI screening.  Patient relates that about 10 days ago he called his wife cheating on him.  He relates that shortly after that he started having a stinging of the penile shaft.  Relates he is not having any dysuria, or frequency.  Patient relates he is also not having any penile discharge, no penile ulcerations.  Patient is just concerned about the possibility of STI due to his wife having close contact with someone else. Patient has been counseled about his blood pressure being elevated, this is been present for the past couple years on each of his visits.  Patient does relate he has a family history of high blood pressure with his mother and father who are deceased, and also his brother is presently on blood pressure medicine.  Patient does relate he sometimes has some headaches, and occasionally has some intermittent blurry type vision.  Patient relates he does not have any chest pain or shortness of breath with activity patient has been advised that he needs to start low-dose blood pressure medicine to help control his high blood pressure, he is receptive of this and willing to start the medication.   Exposure to STD   Past Medical History:  Diagnosis Date   Hypertension     Patient Active Problem List   Diagnosis Date Noted   Chronic cough 07/27/2017   Abnormal echocardiogram 01/08/2014   Mixed hyperlipidemia 11/10/2013   GERD (gastroesophageal reflux disease) 10/05/2013   Situational syncope 10/05/2013   Atypical chest pain 10/05/2013   Prehypertension 09/25/2013   Renal insufficiency 09/25/2013    Past Surgical History:  Procedure Laterality Date   NO PAST SURGERIES         Home Medications    Prior to Admission medications    Medication Sig Start Date End Date Taking? Authorizing Provider  benazepril (LOTENSIN) 10 MG tablet Take 1 tablet (10 mg total) by mouth daily. 07/29/21  Yes Ellsworth Lennox, PA-C  aspirin EC 81 MG tablet Take 1 tablet (81 mg total) by mouth daily. Swallow whole. Patient not taking: Reported on 06/18/2020 08/30/19   Sande Rives, MD  doxycycline (VIBRAMYCIN) 100 MG capsule Take 1 capsule (100 mg total) by mouth 2 (two) times daily. Patient not taking: Reported on 06/18/2020 01/03/20   Mardella Layman, MD  rosuvastatin (CRESTOR) 10 MG tablet Take 1 tablet (10 mg total) by mouth daily. 08/30/19 11/28/19  O'Neal, Ronnald Ramp, MD    Family History Family History  Problem Relation Age of Onset   Other Father        cardiac stent   Leukemia Father    Heart disease Father    Other Brother        cardiac stent   Emphysema Mother    Cancer Mother        Unspecified    Social History Social History   Tobacco Use   Smoking status: Never   Smokeless tobacco: Never  Vaping Use   Vaping Use: Never used  Substance Use Topics   Alcohol use: Yes    Comment: occ   Drug use: No     Allergies   Patient has no known allergies.   Review of  Systems Review of Systems  Genitourinary:  Positive for penile pain (Penile tingling).    Physical Exam Triage Vital Signs ED Triage Vitals  Enc Vitals Group     BP 07/29/21 0914 (!) 166/113     Pulse Rate 07/29/21 0914 62     Resp 07/29/21 0914 16     Temp 07/29/21 0914 98.3 F (36.8 C)     Temp Source 07/29/21 0914 Oral     SpO2 07/29/21 0914 94 %     Weight --      Height --      Head Circumference --      Peak Flow --      Pain Score 07/29/21 0917 0     Pain Loc --      Pain Edu? --      Excl. in GC? --    No data found.  Updated Vital Signs BP (!) 166/113 (BP Location: Right Arm)   Pulse 62   Temp 98.3 F (36.8 C) (Oral)   Resp 16   SpO2 94%   Visual Acuity Right Eye Distance:   Left Eye Distance:   Bilateral  Distance:    Right Eye Near:   Left Eye Near:    Bilateral Near:     Physical Exam Constitutional:      Appearance: Normal appearance.  HENT:     Right Ear: Tympanic membrane and ear canal normal.     Left Ear: Tympanic membrane and ear canal normal.     Mouth/Throat:     Mouth: Mucous membranes are moist.     Pharynx: Oropharynx is clear. Uvula midline.  Neck:     Comments: Carotids: Bruits noted bilaterally Cardiovascular:     Rate and Rhythm: Normal rate and regular rhythm.     Heart sounds: Normal heart sounds.  Pulmonary:     Effort: Pulmonary effort is normal.     Breath sounds: Normal air entry. Decreased breath sounds present. No wheezing, rhonchi or rales.  Abdominal:     General: Abdomen is flat. Bowel sounds are normal.     Palpations: Abdomen is soft.     Tenderness: There is no abdominal tenderness.  Genitourinary:    Penis: Normal. No discharge or lesions.      Testes: Normal.  Lymphadenopathy:     Cervical: No cervical adenopathy.  Neurological:     Mental Status: He is alert.     UC Treatments / Results  Labs (all labs ordered are listed, but only abnormal results are displayed) Labs Reviewed  RPR  HIV ANTIBODY (ROUTINE TESTING W REFLEX)  CYTOLOGY, (ORAL, ANAL, URETHRAL) ANCILLARY ONLY    EKG   Radiology No results found.  Procedures Procedures (including critical care time)  Medications Ordered in UC Medications - No data to display  Initial Impression / Assessment and Plan / UC Course  I have reviewed the triage vital signs and the nursing notes.  Pertinent labs & imaging results that were available during my care of the patient were reviewed by me and considered in my medical decision making (see chart for details).    Plan: 1.Patient advised to start his blood pressure medicine Lotensin 10 mg once a day 2.Patient advised to establish with a PCP. STI testing is pending. 3.Patient advised to watch diet, exercise on a regular  basis. 4.Patient advised to have blood pressure checked in about 3 to 4 weeks since he is starting Lotensin. Final Clinical Impressions(s) / UC Diagnoses   Final  diagnoses:  STD exposure  Primary hypertension     Discharge Instructions      Advised to watch diet, low-salt, try to avoid fried foods. Advised to exercise on a regular basis 20 to 30 minutes 3 to 4 days a week. Advised to become established with PCP. Advised to get blood pressure rechecked after being on Lotensin for 4 weeks.    ED Prescriptions     Medication Sig Dispense Auth. Provider   benazepril (LOTENSIN) 10 MG tablet Take 1 tablet (10 mg total) by mouth daily. 90 tablet Ellsworth LennoxJames, Etoy Mcdonnell, PA-C      PDMP not reviewed this encounter.   Ellsworth LennoxJames, Sholom Dulude, PA-C 07/29/21 [phone]

## 2021-07-29 NOTE — ED Triage Notes (Signed)
Pt states he wants to be tested for STD. States he found out his wife was with someone else and his penis is tingling. Denies any discharge.

## 2021-07-29 NOTE — Discharge Instructions (Addendum)
Advised to watch diet, low-salt, try to avoid fried foods. Advised to exercise on a regular basis 20 to 30 minutes 3 to 4 days a week. Advised to become established with PCP. Advised to get blood pressure rechecked after being on Lotensin for 4 weeks.

## 2021-07-30 LAB — CYTOLOGY, (ORAL, ANAL, URETHRAL) ANCILLARY ONLY
Chlamydia: NEGATIVE
Comment: NEGATIVE
Comment: NEGATIVE
Comment: NORMAL
Neisseria Gonorrhea: NEGATIVE
Trichomonas: NEGATIVE

## 2021-07-30 LAB — RPR: RPR Ser Ql: NONREACTIVE

## 2021-08-12 ENCOUNTER — Encounter: Payer: Self-pay | Admitting: *Deleted

## 2021-10-08 ENCOUNTER — Encounter (HOSPITAL_COMMUNITY): Payer: Self-pay

## 2021-10-08 ENCOUNTER — Ambulatory Visit (HOSPITAL_COMMUNITY)
Admission: EM | Admit: 2021-10-08 | Discharge: 2021-10-08 | Disposition: A | Discharge status: Home / Self Care | Payer: Commercial Managed Care - HMO | Attending: Family Medicine | Admitting: Family Medicine

## 2021-10-08 DIAGNOSIS — L918 Other hypertrophic disorders of the skin: Secondary | ICD-10-CM | POA: Diagnosis not present

## 2021-10-08 MED ORDER — SILVER NITRATE-POT NITRATE 75-25 % EX MISC
CUTANEOUS | Status: AC
  Filled 2021-10-08: qty 10

## 2021-10-08 NOTE — Discharge Instructions (Signed)
You have had labs (skin specimen to pathology) today. We will call you with any significant abnormalities or if there is need to begin or change treatment or pursue further follow up.  You may also review your test results online through MyChart. If you do not have a MyChart account, instructions to sign up should be on your discharge paperwork.

## 2021-10-08 NOTE — ED Provider Notes (Signed)
  [facility]   [id number] 10/08/21 Arrival Time: 1218  ASSESSMENT & PLAN:  1. Inflamed skin tag    Skin Tag Removal Procedure Note  Anesthesia: 1% lidocaine with epinephrine  Procedure Details  The procedure, risks and complications have been discussed in detail (including, but not limited to pain and bleeding) with the patient.  The skin induration was prepped and draped in the usual fashion. After adequate local anesthesia, shave excision of skin tag on LEFT upper back measuring approx 0.5 x 1 cm performed with a #15 blade. Bleeding controlled. Specimen sent to pathology. Bleeding controlled with silver nitrate. Bandage applied.  EBL: minimal Drains: none Packing: n/a Condition: Tolerated procedure well Complications: none.  OTC analgesics as needed.  Reviewed expectations re: course of current medical issues. Questions answered. Outlined signs and symptoms indicating need for more acute intervention. Patient verbalized understanding. After Visit Summary given.   SUBJECTIVE:  Dustin Luna is a 51 y.o. male who reports skin tag of upper LEFT back; x years; gets caught on clothing frequently. At times very inflamed. No bleeding or drainage currently.   OBJECTIVE:  Vitals:   10/08/21 1403  BP: 134/89  Pulse: 66  Resp: 18  Temp: 97.8 F (36.6 C)  SpO2: 97%     General appearance: alert; no distress Upper LEFT back: skin tag present measuring approx 0.5 x 1 cm; no active drainage or bleeding Psychological: alert and cooperative; normal mood and affect  No Known Allergies  Past Medical History:  Diagnosis Date   Hypertension    Social History   Socioeconomic History   Marital status: Married    Spouse name: Not on file   Number of children: 5   Years of education: Not on file   Highest education level: Not on file  Occupational History   Not on file  Tobacco Use   Smoking status: Never   Smokeless tobacco: Never  Vaping Use   Vaping Use:  Never used  Substance and Sexual Activity   Alcohol use: Yes    Comment: occ   Drug use: No   Sexual activity: Yes    Partners: Female    Birth control/protection: Condom  Other Topics Concern   Not on file  Social History Narrative   Not on file   Social Determinants of Health   Financial Resource Strain: Not on file  Food Insecurity: Not on file  Transportation Needs: Not on file  Physical Activity: Not on file  Stress: Not on file  Social Connections: Not on file   Family History  Problem Relation Age of Onset   Other Father        cardiac stent   Leukemia Father    Heart disease Father    Other Brother        cardiac stent   Emphysema Mother    Cancer Mother        Unspecified   Past Surgical History:  Procedure Laterality Date   NO PAST SURGERIES              Mardella Layman, MD 10/08/21 1515

## 2021-10-08 NOTE — ED Triage Notes (Signed)
Pt states has a skin tag on his back that keeps getting caught on things. States used skin tag removed with no results.

## 2021-10-10 LAB — SURGICAL PATHOLOGY

## 2022-02-22 IMAGING — DX DG CHEST 2V
2 series · 2 of 2 positions shown · non-contrast
Comparison: September 26, 2013.

CLINICAL DATA: Dyspnea with exertion.

EXAM:
CHEST - 2 VIEW

[chest pa]
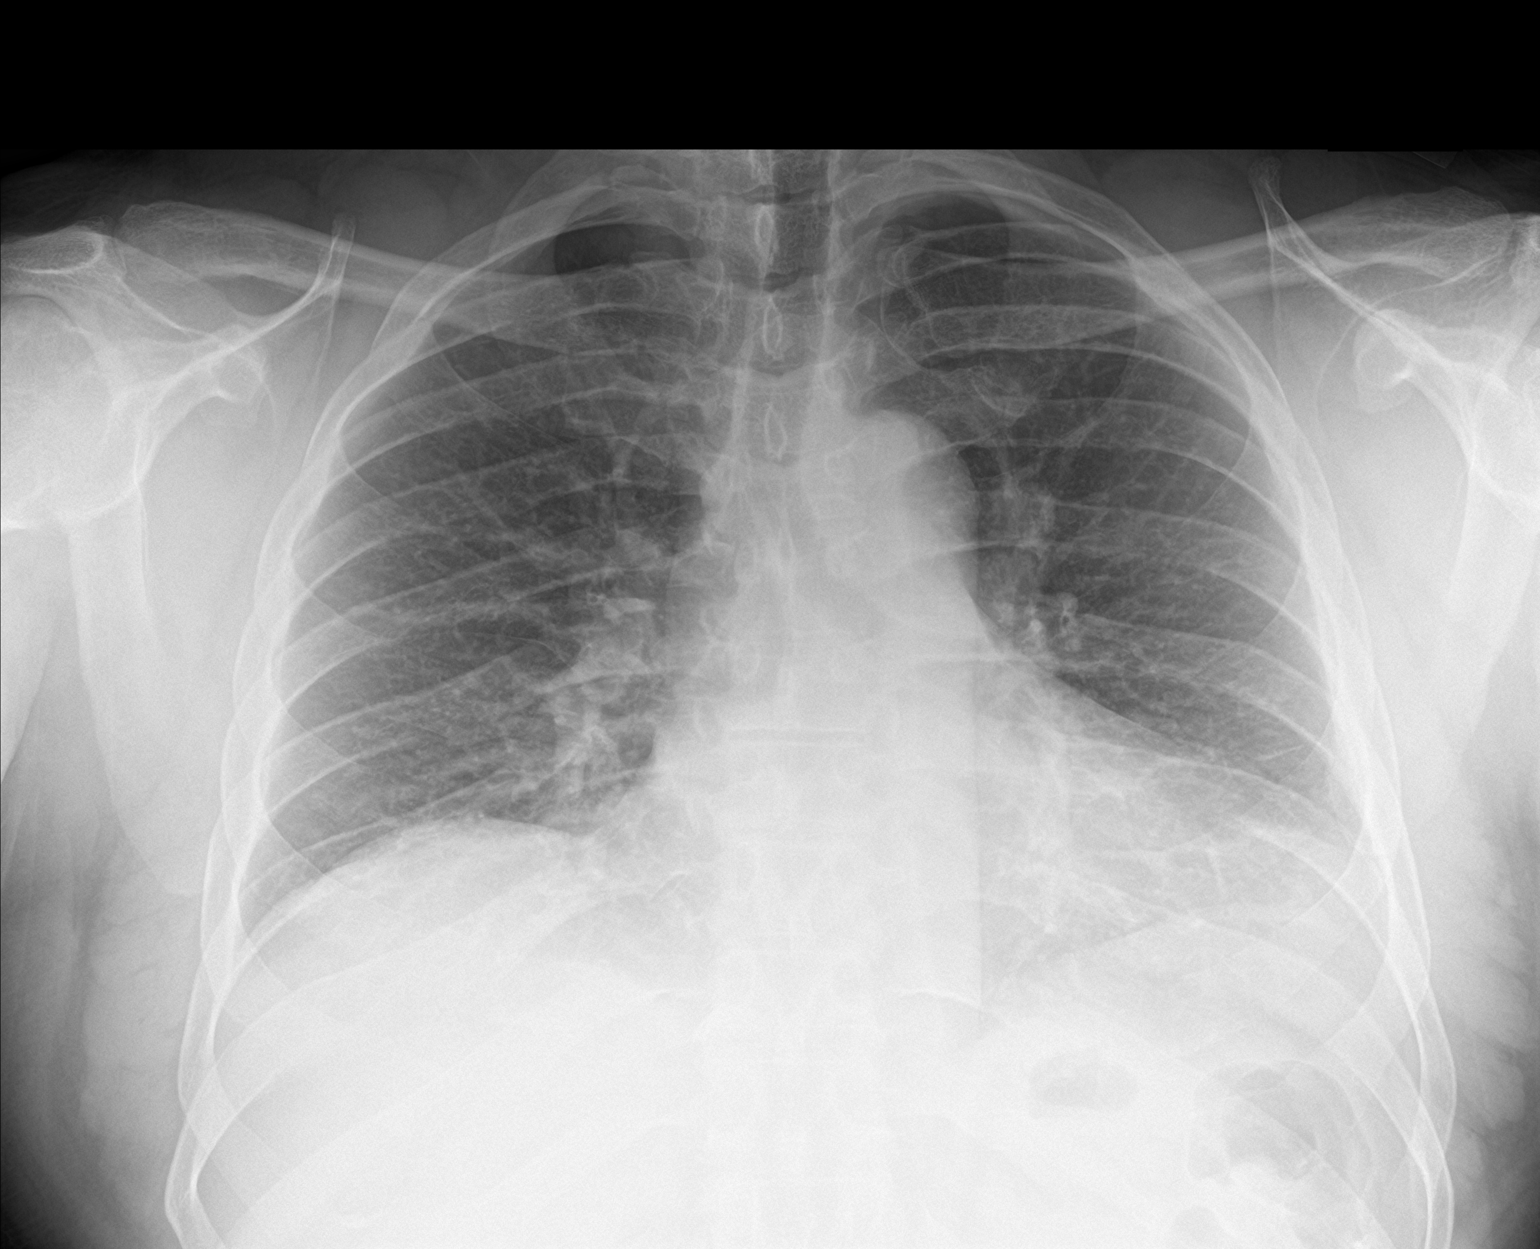

[chest lat]
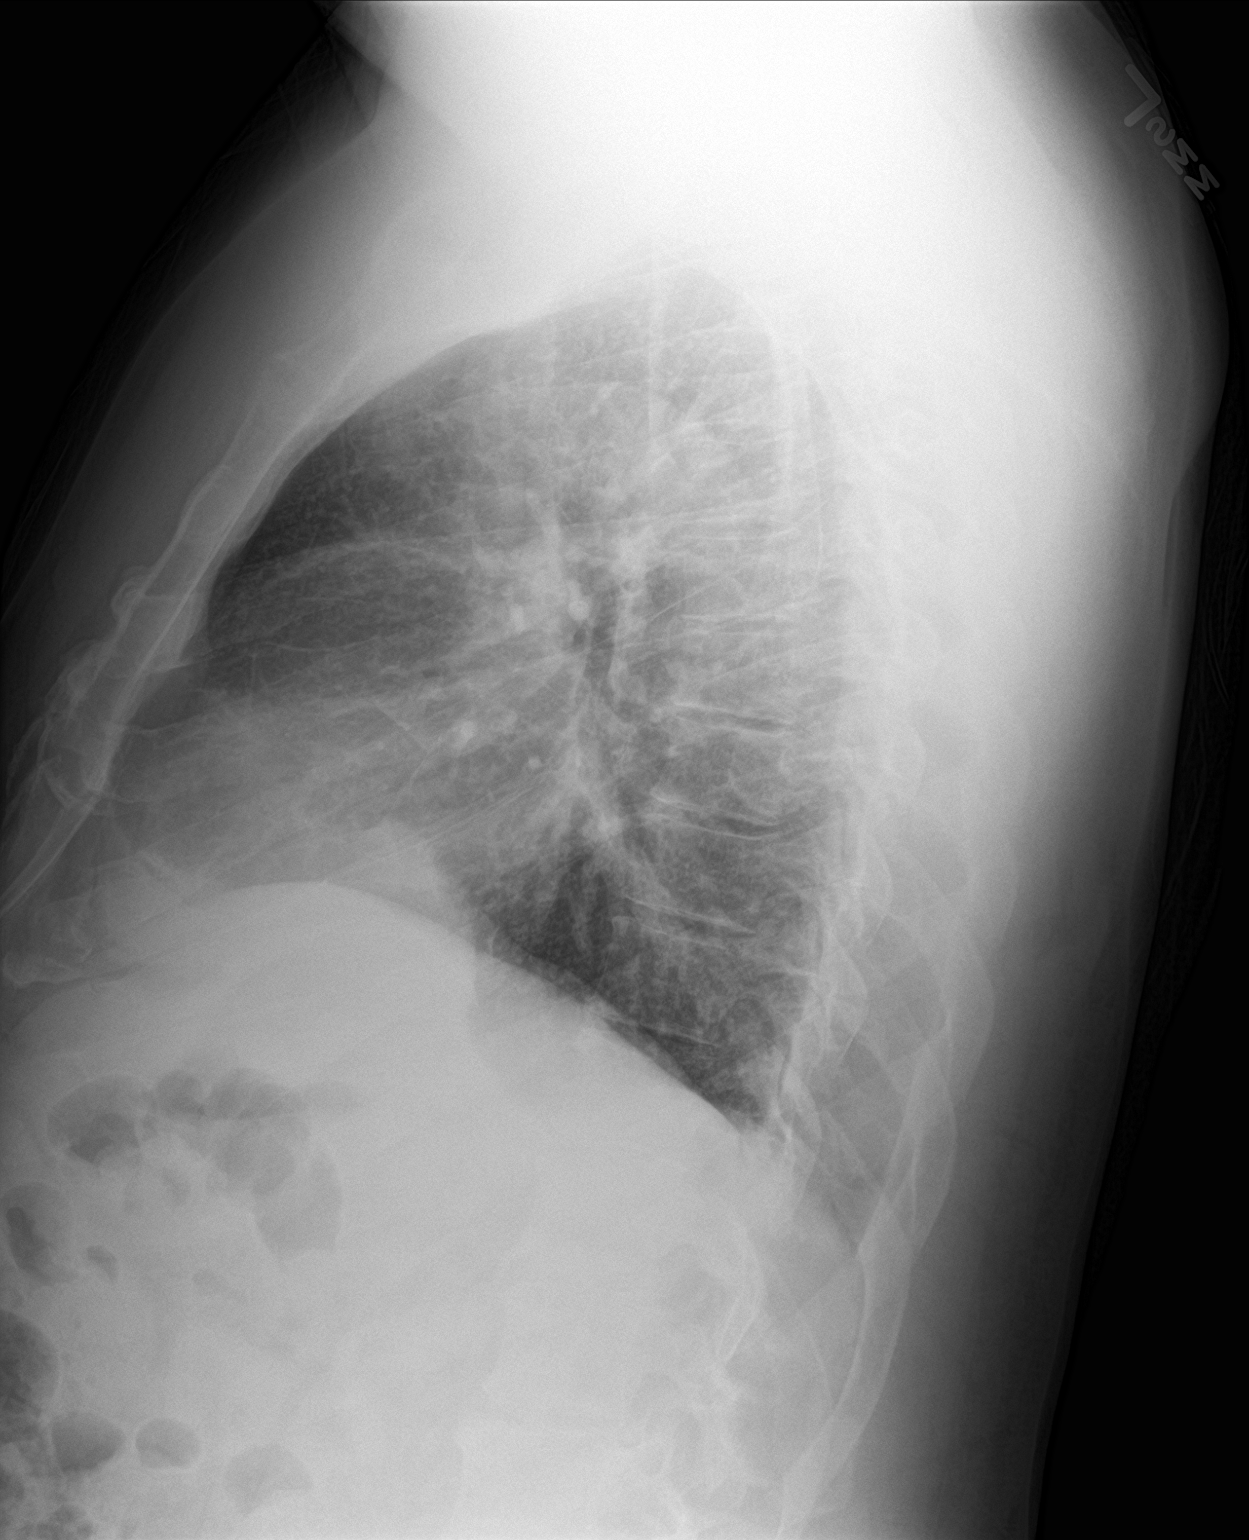

[2 of 2 positions shown; findings below may reference images not displayed]

FINDINGS: There is stable mild cardiomegaly. Inspiration is shallow and likely
contributes to the diffuse interstitial opacities throughout both
lungs and bronchopulmonary vascular crowding. There is no obvious
pleural effusion or pneumonia or overt pulmonary edema. There is no
pneumothorax. Moderate skeletal degenerative changes present.
IMPRESSION: Shallow inspiration with stable mild cardiomegaly. No pneumonia or
pulmonary edema.

## 2023-03-01 ENCOUNTER — Ambulatory Visit
Admission: RE | Admit: 2023-03-01 | Discharge: 2023-03-01 | Disposition: A | Discharge status: Home / Self Care | Payer: Commercial Managed Care - HMO | Source: Ambulatory Visit | Attending: Internal Medicine | Admitting: Internal Medicine

## 2023-03-01 ENCOUNTER — Other Ambulatory Visit: Payer: Self-pay | Admitting: Internal Medicine

## 2023-03-01 DIAGNOSIS — R0789 Other chest pain: Secondary | ICD-10-CM

## 2023-03-01 DIAGNOSIS — R1013 Epigastric pain: Secondary | ICD-10-CM

## 2023-03-26 ENCOUNTER — Encounter (HOSPITAL_BASED_OUTPATIENT_CLINIC_OR_DEPARTMENT_OTHER): Payer: Self-pay

## 2023-03-26 ENCOUNTER — Emergency Department (HOSPITAL_BASED_OUTPATIENT_CLINIC_OR_DEPARTMENT_OTHER): Payer: Commercial Managed Care - HMO | Admitting: Radiology

## 2023-03-26 ENCOUNTER — Emergency Department (HOSPITAL_BASED_OUTPATIENT_CLINIC_OR_DEPARTMENT_OTHER): Payer: Commercial Managed Care - HMO

## 2023-03-26 ENCOUNTER — Emergency Department (HOSPITAL_BASED_OUTPATIENT_CLINIC_OR_DEPARTMENT_OTHER)
Admission: EM | Admit: 2023-03-26 | Discharge: 2023-03-26 | Disposition: A | Discharge status: Home / Self Care | Payer: Commercial Managed Care - HMO | Attending: Emergency Medicine | Admitting: Emergency Medicine

## 2023-03-26 DIAGNOSIS — Z79899 Other long term (current) drug therapy: Secondary | ICD-10-CM | POA: Diagnosis not present

## 2023-03-26 DIAGNOSIS — R1310 Dysphagia, unspecified: Secondary | ICD-10-CM | POA: Diagnosis not present

## 2023-03-26 DIAGNOSIS — R079 Chest pain, unspecified: Secondary | ICD-10-CM | POA: Insufficient documentation

## 2023-03-26 DIAGNOSIS — I10 Essential (primary) hypertension: Secondary | ICD-10-CM | POA: Insufficient documentation

## 2023-03-26 DIAGNOSIS — Z7982 Long term (current) use of aspirin: Secondary | ICD-10-CM | POA: Diagnosis not present

## 2023-03-26 LAB — BASIC METABOLIC PANEL
Anion gap: 8 (ref 5–15)
BUN: 14 mg/dL (ref 6–20)
CO2: 27 mmol/L (ref 22–32)
Calcium: 8.9 mg/dL (ref 8.9–10.3)
Chloride: 102 mmol/L (ref 98–111)
Creatinine, Ser: 1.3 mg/dL — ABNORMAL HIGH (ref 0.61–1.24)
GFR, Estimated: >60 mL/min (ref >60)
Glucose, Bld: 121 mg/dL — ABNORMAL HIGH (ref 70–99)
Potassium: 3.8 mmol/L (ref 3.5–5.1)
Sodium: 137 mmol/L (ref 135–145)

## 2023-03-26 LAB — CBC
HCT: 44.1 % (ref 39.0–52.0)
Hemoglobin: 15.2 g/dL (ref 13.0–17.0)
MCH: 30.8 pg (ref 26.0–34.0)
MCHC: 34.5 g/dL (ref 30.0–36.0)
MCV: 89.5 fL (ref 80.0–100.0)
Platelets: 143 10*3/uL — ABNORMAL LOW (ref 150–400)
RBC: 4.93 MIL/uL (ref 4.22–5.81)
RDW: 12 % (ref 11.5–15.5)
WBC: 8.3 10*3/uL (ref 4.0–10.5)
nRBC: 0 % (ref 0.0–0.2)

## 2023-03-26 LAB — D-DIMER, QUANTITATIVE: D-Dimer, Quant: 0.61 ug{FEU}/mL — ABNORMAL HIGH (ref 0.00–0.50)

## 2023-03-26 LAB — TROPONIN I (HIGH SENSITIVITY)
Troponin I (High Sensitivity): 3 ng/L (ref <18)
Troponin I (High Sensitivity): 3 ng/L (ref <18)

## 2023-03-26 MED ORDER — ALUM & MAG HYDROXIDE-SIMETH 200-200-20 MG/5ML PO SUSP
30.0000 mL | Freq: Once | ORAL | Status: AC
  Administered 2023-03-26: 30 mL via ORAL
  Filled 2023-03-26: qty 30

## 2023-03-26 MED ORDER — IOHEXOL 350 MG/ML SOLN
100.0000 mL | Freq: Once | INTRAVENOUS | Status: AC | PRN
  Administered 2023-03-26: 75 mL via INTRAVENOUS

## 2023-03-26 MED ORDER — PANTOPRAZOLE SODIUM 20 MG PO TBEC: 20.0000 mg | DELAYED_RELEASE_TABLET | Freq: Every day | ORAL | 0 refills | Status: AC

## 2023-03-26 NOTE — ED Triage Notes (Signed)
States sent by doctor to R/O blood clot.  States has pain to left side that radiates into back.  Also have epigastric pain when eats but none at present.  Appears in no distress. States pain worse when turns

## 2023-03-26 NOTE — ED Provider Notes (Signed)
[facility] Provider Note  CSN: [id number] Arrival date & time: 03/26/23 1755  Chief Complaint(s) Chest Pain  HPI Dustin Luna is a 53 y.o. male with past medical history as below, significant for hypertension, HLD, GERD, renal insufficiency who presents to the ED with complaint of chest pain, abnormal labs  He is having intermittent chest pain over the past few days, intermittent difficulty breathing which is since improved.  He was eating [employer] yesterday and had some discomfort to his chest, felt like food got stuck in his throat.  This sensation subsided.  He saw PCP and had labs drawn, he had D-dimer which was elevated and advised to come to the ER for evaluation. He has no dyspnea.  He has some mild pain to his right side chest wall over the last 2 days. Worsened w/ position changes or turning his torso   Past Medical History Past Medical History:  Diagnosis Date  . Hypertension    Patient Active Problem List   Diagnosis Date Noted  . Chronic cough 07/27/2017  . Abnormal echocardiogram 01/08/2014  . Mixed hyperlipidemia 11/10/2013  . GERD (gastroesophageal reflux disease) 10/05/2013  . Situational syncope 10/05/2013  . Atypical chest pain 10/05/2013  . Prehypertension 09/25/2013  . Renal insufficiency 09/25/2013   Home Medication(s) Prior to Admission medications   Medication Sig Start Date End Date Taking? Authorizing Provider  aspirin EC 81 MG tablet Take 1 tablet (81 mg total) by mouth daily. Swallow whole. Patient not taking: Reported on 06/18/2020 08/30/19   Sande Rives, MD  benazepril (LOTENSIN) 10 MG tablet Take 1 tablet (10 mg total) by mouth daily. Patient not taking: Reported on 10/08/2021 07/29/21   Ellsworth Lennox, PA-C  doxycycline (VIBRAMYCIN) 100 MG capsule Take 1 capsule (100 mg total) by mouth 2 (two) times daily. Patient not taking: Reported on 06/18/2020 01/03/20   Mardella Layman, MD  rosuvastatin  (CRESTOR) 10 MG tablet Take 1 tablet (10 mg total) by mouth daily. Patient not taking: Reported on 10/08/2021 08/30/19 11/28/19  Sande Rives, MD                                                                                                                                    Past Surgical History Past Surgical History:  Procedure Laterality Date  . NO PAST SURGERIES     Family History Family History  Problem Relation Age of Onset  . Other Father        cardiac stent  . Leukemia Father   . Heart disease Father   . Other Brother        cardiac stent  . Emphysema Mother   . Cancer Mother        Unspecified    Social History Social History   Tobacco Use  . Smoking status: Never  . Smokeless tobacco: Never  Vaping Use  . Vaping status: Never  Used  Substance Use Topics  . Alcohol use: Yes    Comment: occ  . Drug use: No   Allergies Patient has no known allergies.  Review of Systems Review of Systems  Constitutional:  Negative for chills and fever.  Respiratory:  Positive for chest tightness and shortness of breath.   Cardiovascular:  Positive for chest pain.  Gastrointestinal:  Negative for nausea and vomiting.  Genitourinary:  Negative for dysuria and hematuria.  Musculoskeletal:  Positive for arthralgias.  Neurological:  Negative for weakness and light-headedness.  All other systems reviewed and are negative.   Physical Exam Vital Signs  I have reviewed the triage vital signs BP (!) 161/97 (BP Location: Right Arm)   Pulse 93   Temp 99.9 F (37.7 C)   Resp 20   Ht 5\' 9"  (1.753 m)   Wt 111.1 kg   SpO2 97%   BMI 36.18 kg/m  Physical Exam Vitals and nursing note reviewed.  Constitutional:      General: He is not in acute distress.    Appearance: He is well-developed.  HENT:     Head: Normocephalic and atraumatic.     Right Ear: External ear normal.     Left Ear: External ear normal.     Mouth/Throat:     Mouth: Mucous membranes are moist.  Eyes:      General: No scleral icterus. Cardiovascular:     Rate and Rhythm: Normal rate and regular rhythm.     Pulses: Normal pulses.     Heart sounds: Normal heart sounds.  Pulmonary:     Effort: Pulmonary effort is normal. No tachypnea, accessory muscle usage or respiratory distress.     Breath sounds: Normal breath sounds.  Abdominal:     General: Abdomen is flat.     Palpations: Abdomen is soft.     Tenderness: There is no abdominal tenderness.  Musculoskeletal:       Arms:     Cervical back: No rigidity.     Right lower leg: No edema.     Left lower leg: No edema.  Skin:    General: Skin is warm and dry.     Capillary Refill: Capillary refill takes less than 2 seconds.  Neurological:     Mental Status: He is alert.  Psychiatric:        Mood and Affect: Mood normal.        Behavior: Behavior normal.     ED Results and Treatments Labs (all labs ordered are listed, but only abnormal results are displayed) Labs Reviewed  BASIC METABOLIC PANEL - Abnormal; Notable for the following components:      Result Value   Glucose, Bld 121 (*)    Creatinine, Ser 1.30 (*)    All other components within normal limits  CBC - Abnormal; Notable for the following components:   Platelets 143 (*)    All other components within normal limits  D-DIMER, QUANTITATIVE - Abnormal; Notable for the following components:   D-Dimer, Quant 0.61 (*)    All other components within normal limits  TROPONIN I (HIGH SENSITIVITY)  TROPONIN I (HIGH SENSITIVITY)  Radiology DG Chest 2 View Result Date: 03/26/2023 CLINICAL DATA:  Chest pain EXAM: CHEST - 2 VIEW COMPARISON:  03/01/2023 FINDINGS: The heart size and mediastinal contours are within normal limits. Both lungs are clear. The visualized skeletal structures are unremarkable. IMPRESSION: No active cardiopulmonary disease. Electronically  Signed   By: Jasmine Pang M.D.   On: 03/26/2023 19:03    Pertinent labs & imaging results that were available during my care of the patient were reviewed by me and considered in my medical decision making (see MDM for details).  Medications Ordered in ED Medications - No data to display                                                                                                                                   Procedures Procedures  (including critical care time)  Medical Decision Making / ED Course    Medical Decision Making:    Dustin Luna is a 53 y.o. male with past medical history as below, significant for hypertension, HLD, GERD, renal insufficiency who presents to the ED with complaint of chest pain, abnormal labs. The complaint involves an extensive differential diagnosis and also carries with it a high risk of complications and morbidity.  Serious etiology was considered. Ddx includes but is not limited to: Differential includes all life-threatening causes for chest pain. This includes but is not exclusive to acute coronary syndrome, aortic dissection, pulmonary embolism, cardiac tamponade, community-acquired pneumonia, pericarditis, musculoskeletal chest wall pain, etc.   Complete initial physical exam performed, notably the patient was in no distress, HDS.    Reviewed and confirmed nursing documentation for past medical history, family history, social history.  Vital signs reviewed.    Clinical Course as of 03/26/23 2133  Caleen Essex Mar 26, 2023  2132 Creatinine(!): 1.30 Similar to prior 3 years ago [SG]  2133 D-Dimer, Quant(!): 0.61 Get ctpe [SG]    Clinical Course User Index [SG] Sloan Leiter, DO    Brief summary: 53 year old male with history as below including HLD, GERD send here for abnormal labs from outpatient office.  Elevated D-dimer.  He is well score is low.  Will repeat D-dimer here.  Found to be elevated.  Will obtain CT  PE.                Additional history obtained: -Additional history obtained from na -External records from outside source obtained and reviewed including: Chart review including previous notes, labs, imaging, consultation notes including  Reviewed outpatient labs from patient's cell phone. Urgent care documentation Prior labs   Lab Tests: -I ordered, reviewed, and interpreted labs.   The pertinent results include:   Labs Reviewed  BASIC METABOLIC PANEL - Abnormal; Notable for the following components:      Result Value   Glucose, Bld 121 (*)    Creatinine, Ser 1.30 (*)    All other components within normal limits  CBC -  Abnormal; Notable for the following components:   Platelets 143 (*)    All other components within normal limits  D-DIMER, QUANTITATIVE - Abnormal; Notable for the following components:   D-Dimer, Quant 0.61 (*)    All other components within normal limits  TROPONIN I (HIGH SENSITIVITY)  TROPONIN I (HIGH SENSITIVITY)    Notable for as above  EKG   EKG Interpretation Date/Time:  Friday March 26 2023 18:25:07 EST Ventricular Rate:  88 PR Interval:  148 QRS Duration:  74 QT Interval:  326 QTC Calculation: 394 R Axis:   59  Text Interpretation: Normal sinus rhythm T wave abnormality, consider inferior ischemia Abnormal ECG When compared with ECG of 11-May-2019 18:50, ST no longer elevated in Inferior leads T wave inversion now evident in Inferior leads twi new since prior Confirmed by Tanda Rockers ([id number]) on 03/26/2023 6:27:29 PM         Imaging Studies ordered: I ordered imaging studies including CXR CTPE I independently visualized the following imaging with scope of interpretation limited to determining acute life threatening conditions related to emergency care; findings noted above I independently visualized and interpreted imaging. I agree with the radiologist interpretation   Medicines ordered and prescription drug management: No  orders of the defined types were placed in this encounter.   -I have reviewed the patients home medicines and have made adjustments as needed   Consultations Obtained: I requested consultation with the ***,  and discussed lab and imaging findings as well as pertinent plan - they recommend: ***   Cardiac Monitoring: The patient was maintained on a cardiac monitor.  I personally viewed and interpreted the cardiac monitored which showed an underlying rhythm of: NSR Continuous pulse oximetry interpreted by myself, 98% on RA.    Social Determinants of Health:  Diagnosis or treatment significantly limited by social determinants of health: obesity   Reevaluation: After the interventions noted above, I reevaluated the patient and found that they have improved  Co morbidities that complicate the patient evaluation . Past Medical History:  Diagnosis Date  . Hypertension       Dispostion: Disposition decision including need for hospitalization was considered, and patient {[id number]::"discharged from emergency department."}    Final Clinical Impression(s) / ED Diagnoses Final diagnoses:  None

## 2023-03-26 NOTE — Discharge Instructions (Addendum)
It was a pleasure caring for you today in the emergency department.  Please eat a very bland diet, take Protonix daily over the next 2 weeks, follow up with gastroenterology (stomach doctor) in 2 weeks.  Please return to the emergency department for any worsening or worrisome symptoms.

## 2023-07-05 DIAGNOSIS — K2930 Chronic superficial gastritis without bleeding: Secondary | ICD-10-CM | POA: Diagnosis not present

## 2023-07-05 DIAGNOSIS — R1310 Dysphagia, unspecified: Secondary | ICD-10-CM | POA: Diagnosis not present

## 2023-07-05 DIAGNOSIS — K2970 Gastritis, unspecified, without bleeding: Secondary | ICD-10-CM | POA: Diagnosis not present

## 2023-07-05 DIAGNOSIS — Z1211 Encounter for screening for malignant neoplasm of colon: Secondary | ICD-10-CM | POA: Diagnosis not present

## 2023-07-05 DIAGNOSIS — D125 Benign neoplasm of sigmoid colon: Secondary | ICD-10-CM | POA: Diagnosis not present

## 2023-07-07 DIAGNOSIS — R748 Abnormal levels of other serum enzymes: Secondary | ICD-10-CM | POA: Diagnosis not present

## 2023-07-07 DIAGNOSIS — Z79899 Other long term (current) drug therapy: Secondary | ICD-10-CM | POA: Diagnosis not present

## 2023-07-07 DIAGNOSIS — K2930 Chronic superficial gastritis without bleeding: Secondary | ICD-10-CM | POA: Diagnosis not present

## 2023-07-07 DIAGNOSIS — D125 Benign neoplasm of sigmoid colon: Secondary | ICD-10-CM | POA: Diagnosis not present
